# Patient Record
Sex: Male | Born: 1935 | Race: Black or African American | Hispanic: No | Marital: Married | Smoking: Former smoker
Health system: Southern US, Community
[De-identification: ages and names within clinical notes are randomized; demographics above are authoritative.]

## PROBLEM LIST (undated history)

## (undated) DIAGNOSIS — I214 Non-ST elevation (NSTEMI) myocardial infarction: Secondary | ICD-10-CM

## (undated) DIAGNOSIS — I517 Cardiomegaly: Secondary | ICD-10-CM

## (undated) DIAGNOSIS — D509 Iron deficiency anemia, unspecified: Secondary | ICD-10-CM

## (undated) DIAGNOSIS — R6251 Failure to thrive (child): Secondary | ICD-10-CM

---

## 1997-03-02 HISTORY — PX: CATARACT EXTRACTION, BILATERAL: SHX1313

## 2016-03-02 DIAGNOSIS — I214 Non-ST elevation (NSTEMI) myocardial infarction: Secondary | ICD-10-CM

## 2016-03-02 HISTORY — DX: Non-ST elevation (NSTEMI) myocardial infarction: I21.4

## 2016-04-30 DIAGNOSIS — R6251 Failure to thrive (child): Secondary | ICD-10-CM

## 2016-04-30 HISTORY — DX: Failure to thrive (child): R62.51

## 2016-05-25 ENCOUNTER — Emergency Department (HOSPITAL_COMMUNITY): Payer: Medicare Other

## 2016-05-25 ENCOUNTER — Encounter (HOSPITAL_COMMUNITY): Payer: Self-pay | Admitting: Emergency Medicine

## 2016-05-25 DIAGNOSIS — K5900 Constipation, unspecified: Secondary | ICD-10-CM | POA: Diagnosis present

## 2016-05-25 DIAGNOSIS — L89150 Pressure ulcer of sacral region, unstageable: Secondary | ICD-10-CM | POA: Diagnosis present

## 2016-05-25 DIAGNOSIS — Z6822 Body mass index (BMI) 22.0-22.9, adult: Secondary | ICD-10-CM | POA: Diagnosis not present

## 2016-05-25 DIAGNOSIS — Z66 Do not resuscitate: Secondary | ICD-10-CM | POA: Diagnosis present

## 2016-05-25 DIAGNOSIS — K761 Chronic passive congestion of liver: Secondary | ICD-10-CM | POA: Diagnosis present

## 2016-05-25 DIAGNOSIS — T68XXXA Hypothermia, initial encounter: Secondary | ICD-10-CM

## 2016-05-25 DIAGNOSIS — I21A1 Myocardial infarction type 2: Secondary | ICD-10-CM | POA: Diagnosis present

## 2016-05-25 DIAGNOSIS — R7401 Elevation of levels of liver transaminase levels: Secondary | ICD-10-CM | POA: Diagnosis present

## 2016-05-25 DIAGNOSIS — E162 Hypoglycemia, unspecified: Secondary | ICD-10-CM | POA: Diagnosis present

## 2016-05-25 DIAGNOSIS — Z9189 Other specified personal risk factors, not elsewhere classified: Secondary | ICD-10-CM

## 2016-05-25 DIAGNOSIS — I429 Cardiomyopathy, unspecified: Secondary | ICD-10-CM | POA: Diagnosis present

## 2016-05-25 DIAGNOSIS — I5021 Acute systolic (congestive) heart failure: Secondary | ICD-10-CM | POA: Diagnosis not present

## 2016-05-25 DIAGNOSIS — Z515 Encounter for palliative care: Secondary | ICD-10-CM | POA: Diagnosis present

## 2016-05-25 DIAGNOSIS — Z7401 Bed confinement status: Secondary | ICD-10-CM

## 2016-05-25 DIAGNOSIS — R680 Hypothermia, not associated with low environmental temperature: Secondary | ICD-10-CM | POA: Diagnosis present

## 2016-05-25 DIAGNOSIS — R748 Abnormal levels of other serum enzymes: Secondary | ICD-10-CM

## 2016-05-25 DIAGNOSIS — N179 Acute kidney failure, unspecified: Secondary | ICD-10-CM | POA: Diagnosis present

## 2016-05-25 DIAGNOSIS — G9341 Metabolic encephalopathy: Secondary | ICD-10-CM | POA: Diagnosis present

## 2016-05-25 DIAGNOSIS — E860 Dehydration: Secondary | ICD-10-CM | POA: Diagnosis present

## 2016-05-25 DIAGNOSIS — R001 Bradycardia, unspecified: Secondary | ICD-10-CM | POA: Diagnosis not present

## 2016-05-25 DIAGNOSIS — Z87891 Personal history of nicotine dependence: Secondary | ICD-10-CM

## 2016-05-25 DIAGNOSIS — E43 Unspecified severe protein-calorie malnutrition: Secondary | ICD-10-CM | POA: Diagnosis present

## 2016-05-25 DIAGNOSIS — I517 Cardiomegaly: Secondary | ICD-10-CM

## 2016-05-25 DIAGNOSIS — R338 Other retention of urine: Secondary | ICD-10-CM

## 2016-05-25 DIAGNOSIS — R9431 Abnormal electrocardiogram [ECG] [EKG]: Secondary | ICD-10-CM | POA: Diagnosis not present

## 2016-05-25 DIAGNOSIS — I214 Non-ST elevation (NSTEMI) myocardial infarction: Secondary | ICD-10-CM | POA: Diagnosis present

## 2016-05-25 DIAGNOSIS — R339 Retention of urine, unspecified: Secondary | ICD-10-CM | POA: Diagnosis present

## 2016-05-25 DIAGNOSIS — Z789 Other specified health status: Secondary | ICD-10-CM | POA: Diagnosis present

## 2016-05-25 DIAGNOSIS — R7989 Other specified abnormal findings of blood chemistry: Secondary | ICD-10-CM | POA: Diagnosis present

## 2016-05-25 DIAGNOSIS — D509 Iron deficiency anemia, unspecified: Secondary | ICD-10-CM | POA: Diagnosis present

## 2016-05-25 DIAGNOSIS — R627 Adult failure to thrive: Secondary | ICD-10-CM | POA: Diagnosis present

## 2016-05-25 DIAGNOSIS — D649 Anemia, unspecified: Secondary | ICD-10-CM | POA: Diagnosis not present

## 2016-05-25 DIAGNOSIS — E875 Hyperkalemia: Secondary | ICD-10-CM | POA: Diagnosis present

## 2016-05-25 DIAGNOSIS — R778 Other specified abnormalities of plasma proteins: Secondary | ICD-10-CM

## 2016-05-25 DIAGNOSIS — L8990 Pressure ulcer of unspecified site, unspecified stage: Secondary | ICD-10-CM | POA: Insufficient documentation

## 2016-05-25 DIAGNOSIS — R740 Nonspecific elevation of levels of transaminase and lactic acid dehydrogenase [LDH]: Secondary | ICD-10-CM

## 2016-05-25 DIAGNOSIS — I5023 Acute on chronic systolic (congestive) heart failure: Secondary | ICD-10-CM | POA: Diagnosis present

## 2016-05-25 DIAGNOSIS — R4182 Altered mental status, unspecified: Secondary | ICD-10-CM | POA: Diagnosis present

## 2016-05-25 DIAGNOSIS — T148XXA Other injury of unspecified body region, initial encounter: Secondary | ICD-10-CM | POA: Diagnosis present

## 2016-05-25 HISTORY — DX: Failure to thrive (child): R62.51

## 2016-05-25 HISTORY — DX: Iron deficiency anemia, unspecified: D50.9

## 2016-05-25 HISTORY — DX: Non-ST elevation (NSTEMI) myocardial infarction: I21.4

## 2016-05-25 HISTORY — DX: Cardiomegaly: I51.7

## 2016-05-25 LAB — CBC
HCT: 26.4 % — ABNORMAL LOW (ref 39.0–52.0)
Hemoglobin: 8.9 g/dL — ABNORMAL LOW (ref 13.0–17.0)
MCH: 26.6 pg (ref 26.0–34.0)
MCHC: 33.7 g/dL (ref 30.0–36.0)
MCV: 79 fL (ref 78.0–100.0)
PLATELETS: 71 10*3/uL — AB (ref 150–400)
RBC: 3.34 MIL/uL — AB (ref 4.22–5.81)
RDW: 17.9 % — ABNORMAL HIGH (ref 11.5–15.5)
WBC: 7.8 10*3/uL (ref 4.0–10.5)

## 2016-05-25 LAB — I-STAT CHEM 8, ED
BUN: 76 mg/dL — AB (ref 6–20)
CALCIUM ION: 0.98 mmol/L — AB (ref 1.15–1.40)
CHLORIDE: 105 mmol/L (ref 101–111)
Creatinine, Ser: 1.5 mg/dL — ABNORMAL HIGH (ref 0.61–1.24)
Glucose, Bld: 149 mg/dL — ABNORMAL HIGH (ref 65–99)
HEMATOCRIT: 18 % — AB (ref 39.0–52.0)
Hemoglobin: 6.1 g/dL — CL (ref 13.0–17.0)
Potassium: 4.7 mmol/L (ref 3.5–5.1)
Sodium: 137 mmol/L (ref 135–145)
TCO2: 19 mmol/L (ref 0–100)

## 2016-05-25 LAB — RETICULOCYTES
RBC.: 2.22 MIL/uL — AB (ref 4.22–5.81)
RETIC COUNT ABSOLUTE: 22.2 10*3/uL (ref 19.0–186.0)
RETIC CT PCT: 1 % (ref 0.4–3.1)

## 2016-05-25 LAB — COMPREHENSIVE METABOLIC PANEL
ALBUMIN: 2 g/dL — AB (ref 3.5–5.0)
ALT: 26 U/L (ref 17–63)
AST: 130 U/L — AB (ref 15–41)
Alkaline Phosphatase: 111 U/L (ref 38–126)
Anion gap: 12 (ref 5–15)
BUN: 81 mg/dL — ABNORMAL HIGH (ref 6–20)
CHLORIDE: 104 mmol/L (ref 101–111)
CO2: 20 mmol/L — AB (ref 22–32)
CREATININE: 1.24 mg/dL (ref 0.61–1.24)
Calcium: 7.4 mg/dL — ABNORMAL LOW (ref 8.9–10.3)
GFR calc non Af Amer: 52 mL/min — ABNORMAL LOW (ref >60)
Glucose, Bld: 148 mg/dL — ABNORMAL HIGH (ref 65–99)
Potassium: 4.7 mmol/L (ref 3.5–5.1)
SODIUM: 136 mmol/L (ref 135–145)
Total Bilirubin: 1.2 mg/dL (ref 0.3–1.2)
Total Protein: 6.2 g/dL — ABNORMAL LOW (ref 6.5–8.1)

## 2016-05-25 LAB — GLUCOSE, CAPILLARY
GLUCOSE-CAPILLARY: 106 mg/dL — AB (ref 65–99)
GLUCOSE-CAPILLARY: 83 mg/dL (ref 65–99)
GLUCOSE-CAPILLARY: 94 mg/dL (ref 65–99)
Glucose-Capillary: 89 mg/dL (ref 65–99)
Glucose-Capillary: 71 mg/dL (ref 65–99)
Glucose-Capillary: 90 mg/dL (ref 65–99)

## 2016-05-25 LAB — LACTATE DEHYDROGENASE: LDH: 482 U/L — AB (ref 98–192)

## 2016-05-25 LAB — CBC WITH DIFFERENTIAL/PLATELET
BASOS ABS: 0 10*3/uL (ref 0.0–0.1)
BASOS PCT: 0 %
EOS ABS: 0 10*3/uL (ref 0.0–0.7)
EOS PCT: 0 %
HCT: 17.1 % — ABNORMAL LOW (ref 39.0–52.0)
Hemoglobin: 5.4 g/dL — CL (ref 13.0–17.0)
Lymphocytes Relative: 12 %
Lymphs Abs: 0.7 10*3/uL (ref 0.7–4.0)
MCH: 24.3 pg — AB (ref 26.0–34.0)
MCHC: 31.6 g/dL (ref 30.0–36.0)
MCV: 77 fL — ABNORMAL LOW (ref 78.0–100.0)
Monocytes Absolute: 0.2 10*3/uL (ref 0.1–1.0)
Monocytes Relative: 4 %
Neutro Abs: 4.9 10*3/uL (ref 1.7–7.7)
Neutrophils Relative %: 84 %
PLATELETS: 206 10*3/uL (ref 150–400)
RBC: 2.22 MIL/uL — AB (ref 4.22–5.81)
RDW: 19 % — ABNORMAL HIGH (ref 11.5–15.5)
WBC: 5.8 10*3/uL (ref 4.0–10.5)

## 2016-05-25 LAB — PROCALCITONIN: PROCALCITONIN: 18.35 ng/mL

## 2016-05-25 LAB — CBG MONITORING, ED
GLUCOSE-CAPILLARY: 77 mg/dL (ref 65–99)
GLUCOSE-CAPILLARY: 89 mg/dL (ref 65–99)
GLUCOSE-CAPILLARY: 104 mg/dL — AB (ref 65–99)
Glucose-Capillary: 121 mg/dL — ABNORMAL HIGH (ref 65–99)
Glucose-Capillary: 121 mg/dL — ABNORMAL HIGH (ref 65–99)
Glucose-Capillary: 144 mg/dL — ABNORMAL HIGH (ref 65–99)

## 2016-05-25 LAB — URINALYSIS, ROUTINE W REFLEX MICROSCOPIC
BILIRUBIN URINE: NEGATIVE
GLUCOSE, UA: NEGATIVE mg/dL
Ketones, ur: NEGATIVE mg/dL
Nitrite: NEGATIVE
PROTEIN: NEGATIVE mg/dL
Specific Gravity, Urine: 1.013 (ref 1.005–1.030)
pH: 5 (ref 5.0–8.0)

## 2016-05-25 LAB — IRON AND TIBC
IRON: 11 ug/dL — AB (ref 45–182)
Saturation Ratios: 6 % — ABNORMAL LOW (ref 17.9–39.5)
TIBC: 188 ug/dL — ABNORMAL LOW (ref 250–450)
UIBC: 177 ug/dL

## 2016-05-25 LAB — VITAMIN B12: Vitamin B-12: 4200 pg/mL — ABNORMAL HIGH (ref 180–914)

## 2016-05-25 LAB — BRAIN NATRIURETIC PEPTIDE

## 2016-05-25 LAB — PREPARE RBC (CROSSMATCH)

## 2016-05-25 LAB — I-STAT CG4 LACTIC ACID, ED: Lactic Acid, Venous: 2.23 mmol/L (ref 0.5–1.9)

## 2016-05-25 LAB — TROPONIN I: Troponin I: 1.64 ng/mL (ref <0.03)

## 2016-05-25 LAB — I-STAT TROPONIN, ED: Troponin i, poc: 2.23 ng/mL (ref 0.00–0.08)

## 2016-05-25 LAB — POC OCCULT BLOOD, ED: Fecal Occult Bld: NEGATIVE

## 2016-05-25 LAB — TSH: TSH: 2.205 u[IU]/mL (ref 0.350–4.500)

## 2016-05-25 LAB — ABO/RH: ABO/RH(D): A POS

## 2016-05-25 LAB — FOLATE: Folate: 19.1 ng/mL (ref >5.9)

## 2016-05-25 LAB — FERRITIN: FERRITIN: 97 ng/mL (ref 24–336)

## 2016-05-25 LAB — MRSA PCR SCREENING: MRSA BY PCR: NEGATIVE

## 2016-05-25 LAB — PROTIME-INR
INR: 2.09
Prothrombin Time: 23.8 seconds — ABNORMAL HIGH (ref 11.4–15.2)

## 2016-05-25 LAB — MAGNESIUM: Magnesium: 2.3 mg/dL (ref 1.7–2.4)

## 2016-05-25 MED ORDER — FLEET ENEMA 7-19 GM/118ML RE ENEM
1.0000 | ENEMA | Freq: Once | RECTAL | Status: DC | PRN
Start: 2016-05-25 — End: 2016-05-26

## 2016-05-25 MED ORDER — SODIUM CHLORIDE 0.9% FLUSH
3.0000 mL | Freq: Two times a day (BID) | INTRAVENOUS | Status: DC
  Administered 2016-05-25 – 2016-05-26 (×2): 3 mL via INTRAVENOUS

## 2016-05-25 MED ORDER — POLYETHYLENE GLYCOL 3350 17 G PO PACK: 17.0000 g | PACK | Freq: Every day | ORAL | Status: DC | PRN

## 2016-05-25 MED ORDER — BOOST / RESOURCE BREEZE PO LIQD
1.0000 | Freq: Three times a day (TID) | ORAL | Status: DC
  Administered 2016-05-25: 1 via ORAL
  Filled 2016-05-25: qty 1

## 2016-05-25 MED ORDER — DEXTROSE 50 % IV SOLN
50.0000 mL | Freq: Once | INTRAVENOUS | Status: AC
  Administered 2016-05-25: 50 mL via INTRAVENOUS
  Filled 2016-05-25: qty 50

## 2016-05-25 MED ORDER — ONDANSETRON HCL 4 MG/2ML IJ SOLN: 4.0000 mg | Freq: Four times a day (QID) | INTRAMUSCULAR | Status: DC | PRN

## 2016-05-25 MED ORDER — SODIUM CHLORIDE 0.9 % IV SOLN
10.0000 mL/h | Freq: Once | INTRAVENOUS | Status: AC
  Administered 2016-05-25: 10 mL/h via INTRAVENOUS

## 2016-05-25 MED ORDER — FUROSEMIDE 10 MG/ML IJ SOLN
40.0000 mg | Freq: Once | INTRAMUSCULAR | Status: DC
  Filled 2016-05-25: qty 4

## 2016-05-25 MED ORDER — DEXTROSE-NACL 5-0.9 % IV SOLN
INTRAVENOUS | Status: DC
  Administered 2016-05-25: 21:00:00 via INTRAVENOUS

## 2016-05-25 MED ORDER — DEXTROSE 10 % IV SOLN
INTRAVENOUS | Status: DC
  Administered 2016-05-25: 06:00:00 via INTRAVENOUS

## 2016-05-25 MED ORDER — SODIUM CHLORIDE 0.9 % IV BOLUS (SEPSIS): 1000.0000 mL | Freq: Once | INTRAVENOUS | Status: DC

## 2016-05-25 MED ORDER — DOCUSATE SODIUM 100 MG PO CAPS
100.0000 mg | ORAL_CAPSULE | Freq: Two times a day (BID) | ORAL | Status: DC
  Administered 2016-05-26: 100 mg via ORAL
  Filled 2016-05-25: qty 1

## 2016-05-25 MED ORDER — DEXTROSE 50 % IV SOLN
1.0000 | Freq: Once | INTRAVENOUS | Status: AC
Start: 2016-05-25 — End: 2016-05-25
  Administered 2016-05-25: 50 mL via INTRAVENOUS

## 2016-05-25 MED ORDER — DEXTROSE 50 % IV SOLN
INTRAVENOUS | Status: AC
  Administered 2016-05-25: 05:00:00
  Filled 2016-05-25: qty 50

## 2016-05-25 MED ORDER — BISACODYL 10 MG RE SUPP: 10.0000 mg | Freq: Every day | RECTAL | Status: DC | PRN

## 2016-05-25 MED ORDER — ONDANSETRON HCL 4 MG PO TABS: 4.0000 mg | ORAL_TABLET | Freq: Four times a day (QID) | ORAL | Status: DC | PRN

## 2016-05-25 MED ORDER — ENSURE ENLIVE PO LIQD
237.0000 mL | Freq: Two times a day (BID) | ORAL | Status: DC
Start: 2016-05-25 — End: 2016-05-26
  Administered 2016-05-25: 237 mL via ORAL
  Filled 2016-05-25: qty 237

## 2016-05-25 MED ORDER — SODIUM CHLORIDE 0.9 % IV SOLN
Freq: Once | INTRAVENOUS | Status: AC
  Administered 2016-05-25: 10:00:00 via INTRAVENOUS

## 2016-05-25 MED ORDER — SODIUM CHLORIDE 0.9 % IV SOLN: Freq: Once | INTRAVENOUS | Status: DC

## 2016-05-25 MED ORDER — ACETAMINOPHEN 325 MG PO TABS: 650.0000 mg | ORAL_TABLET | Freq: Four times a day (QID) | ORAL | Status: DC | PRN

## 2016-05-25 MED ORDER — ACETAMINOPHEN 650 MG RE SUPP: 650.0000 mg | Freq: Four times a day (QID) | RECTAL | Status: DC | PRN

## 2016-05-25 NOTE — ED Notes (Signed)
Cardiology at bedside evaluating patient.

## 2016-05-25 NOTE — ED Triage Notes (Signed)
Per EMS, pt from home with c/o AMS, lethargy, confusion. Initial CBG-31, approximately 12g D10 given PTA, EMS repeat CBG-41. Per family, pt has had failure to thrive for the last 30 days with a 50lb weight loss. BP-116/64, HR-90

## 2016-05-25 NOTE — Consult Note (Signed)
Cardiology Consult    Patient ID: Brady LappingRobert Simpson MRN: [id number], DOB/AGE: [birth year]   Admit date: 05/24/2016 Date of Consult: 05/24/2016  Primary Physician: No PCP Per Patient Primary Cardiologist: new - Dr. Royann Shiversroitoru Requesting Provider: Dr. Clyde LundborgNiu  Reason for Consult: elevated troponin and BNP   Patient Profile    Brady Simpson is an 83 yo male who has not seen a doctor in many years, [facility] with altered mental status and hypoglycemia. He was found to be anemic that was symptomatic and hypothermic. Cardiology was consulted for elevated troponin and BNP.  Past Medical History   History reviewed. No pertinent past medical history.  Past Surgical History:  Procedure Laterality Date  . CATARACT EXTRACTION, BILATERAL  1999     Allergies  No Known Allergies  History of Present Illness    Brady Simpson was found confused this morning, son called EMS.  Pt was found to be hypoglycemic (CBG 33) and anemic (Hb 5.4). He was given D10 and 1U PRBC. He was hypothermic with a lactic acid > 2.0.  BNP was found elevated to > 4500. Cardiology was consulted for management of previously unknown CHF and elevated troponin.   Pt states he started having LE edema the 1st week of February. He has also lost about 50 lbs over the past 4-5 months. When questioned, pt family states he hasn't been eating well and only snacking on sweets for the past month. Pt son states he had chest pain in Feb 2018; EMS was called but pt refused medical treatment at the time. Pt has refused to visit a PCP or cardiologist. On exam, he appears frail, but is alert and oriented x 4. He denies chest pain, but has had shortness of breath for 3-4 months. He does not have any documented medical problems and takes no medication at home. Per patient's family, he no longer gets out of bed on his own because of foot pain since January; this is likely related to LE edema. Pt family states his swelling is worse at night. He sleeps on  2 pillows, but pt family states this is not due to SOB.    Inpatient Medications    . feeding supplement  1 Container Oral TID BM  . feeding supplement (ENSURE ENLIVE)  237 mL Oral BID BM     Outpatient Medications    Prior to Admission medications   Not on File     Family History     Family History  Problem Relation Age of Onset  . Diabetes Sister   . Hypertension Brother     Social History    Social History   Social History  . Marital status: Married    Spouse name: N/A  . Number of children: N/A  . Years of education: N/A   Occupational History  . Not on file.   Social History Main Topics  . Smoking status: Former Smoker    Types: Cigarettes    Quit date: 2012  . Smokeless tobacco: Never Used  . Alcohol use No     Comment: Quit 2012  . Drug use: No  . Sexual activity: No   Other Topics Concern  . Not on file   Social History Narrative  . No narrative on file     Review of Systems    General:  No chills, fever, night sweats or weight changes.  Cardiovascular:  No chest pain, dyspnea on exertion, orthopnea, palpitations, paroxysmal nocturnal dyspnea Positive for  edema Dermatological: No rash, lesions/masses Respiratory: No cough, Positive for dyspnea Urologic: No hematuria, dysuria Abdominal:   No nausea, vomiting, diarrhea, bright red blood per rectum, melena, or hematemesis Neurologic:  No visual changes, wkns, changes in mental status. All other systems reviewed and are otherwise negative except as noted above.  Physical Exam    Blood pressure 100/82, pulse 65, temperature 97.3 F (36.3 C), temperature source Oral, resp. rate (!) 27, SpO2 93 %.  General: Pleasant, NAD Psych: Normal affect. Neuro: Alert and oriented X 3. Moves all extremities spontaneously. HEENT: Normal  Neck: Supple without bruits, +JVD. Lungs:  Resp regular and unlabored, CTA in upper lobes, diminished in bases. Heart: RRR no murmurs. Abdomen: Soft, non-tender,  non-distended, BS + x 4.  Extremities: No clubbing, cyanosis.  2+-3+ B LE edema. DP/PT/Radials 1+ and equal bilaterally.  Labs    Troponin [facility] of Care Test)  Recent Labs  10-Jun-2016 0513  TROPIPOC 2.23*   No results for input(s): CKTOTAL, CKMB, TROPONINI in the last 72 hours. Lab Results  Component Value Date   WBC 5.8 06-10-16   HGB 6.1 (LL) 06-10-2016   HCT 18.0 (L) 2016-06-10   MCV 77.0 (L) 06-10-2016   PLT 206 2016/06/10     Recent Labs Lab Jun 10, 2016 0510 2016/06/10 0515  NA 136 137  K 4.7 4.7  CL 104 105  CO2 20*  --   BUN 81* 76*  CREATININE 1.24 1.50*  CALCIUM 7.4*  --   PROT 6.2*  --   BILITOT 1.2  --   ALKPHOS 111  --   ALT 26  --   AST 130*  --   GLUCOSE 148* 149*   No results found for: CHOL, HDL, LDLCALC, TRIG No results found for: [facility]   Radiology Studies    Dg Chest Port 1 View  Result Date: Jun 10, 2016 CLINICAL DATA:  Weakness. Altered mental status tonight. Weight loss. EXAM: PORTABLE CHEST 1 VIEW COMPARISON:  None. FINDINGS: Lung volumes are low. Multiple overlying monitoring devices. Mild cardiomegaly. Atherosclerosis of the thoracic aorta. Hazy lung base opacities suggest pleural effusions and bibasilar airspace disease. Mild vascular congestion. Streaky left midlung opacity. No pneumothorax. No evidence of acute osseous abnormality. IMPRESSION: Mild cardiomegaly. Hazy lung base opacities suggest pleural effusions and bibasilar airspace disease, likely atelectasis. Mild vascular congestion. Findings may reflect fluid overload/ CHF. Electronically Signed   By: Rubye Oaks M.D.   On: 06/10/2016 05:34    ECG & Cardiac Imaging    EKG 06/10/2016: sinus rhythm  Echocardiogram 06-10-2016: pending   Assessment & Plan    1. Elevated troponin - troponin 2.23, continue cycling troponin - EKG without clear signs of ischemia - pt denies current and past chest pain - troponin elevation may be due to demand ischemia in the presence of systolic heart  failure and elevated BNP - he is not a candidate for invasive intervention at this time   2. CHF, unknown type / shortness of breath - BNP on admission > 4500 - echocardiogram pending - CXR with cardiomegaly and bilateral pleural effusions - pt is maintaining on room air - he reports several months of shortness of breath - will initiate IV lasix   3. AKI - no previous records - sCr 1.50 (1.24 on admission); foley in place for exact measurement - follow renal function in the presence of diuresis, daily BMP   4. Anemia - 1U PRBC given in ED; repeat Hb 6.1 (5.4) - per primary team   5.  Hypoglycemia  - per primary team; now resolved   6. FTT - encouraged PO intake - family states he has only been eating sweets and occasional fast food because he doesn't like the cooking - family states they give him a bottle of water each day and he drink the water   7. End of Life Decisions: - per patient, he stated he wanted compressions and shock if his heart stops - currently DNR per ED provider - ask that the hospitalist/medicine team clarify code status    Signed, Marcelino Duster, PA-C 05/19/2016, 1:45 PM  I have seen and examined the patient along with Marcelino Duster, PA.  I have reviewed the chart, notes and new data.  I agree with PA's note.  Key new complaints: denies angina recently, but had chest discomfort in February when he declined EMS transport to the hospital. No dyspnea lying flat in bed right now. Key examination changes: 3+ soft pitting edema of both legs, no rales, no S3. JVD is present. Key new findings / data: marked cardiomegaly and CHF on CXR; creat 1.5. Severe anemia, microcytic. NSR, LBBB. "Bradycardia" on telemetry is artifactual (undersensed R waves).  PLAN: Workup and treatment of anemia. Echo. High suspicion for CAD/ischemic CMP, but ECG is nondiagnostic due to LBBB (of unknown chronicity). Invasive CAD workup will have to wait while we identify  the cause of his anemia (any treatment would be associated with a commitment to antiplatelet therapy, with potentially disastrous consequences if he has a GI bleeding source). Clearly hypervolemic. Will benefit from diuretics (needs substantial, but slow diuresis) with monitoring of renal function. Further recommendations after reviewing echo.  Thurmon Fair, MD, [facility] [phone] 05/24/2016, 4:33 PM

## 2016-05-25 NOTE — ED Notes (Signed)
Spoke with NP, current Blood transfusion is to continue. This is his second unit of PRBC. After this unit, obtain  CBC and reevaluate for need of third unit.

## 2016-05-25 NOTE — ED Notes (Signed)
Bair hugger applied, family at bedside

## 2016-05-25 NOTE — H&P (Signed)
History and Physical    Brady LappingRobert Simpson GNF:[id number] DOB: 1931-02-19 DOA: 05/20/2016   PCP: No PCP Per Patient   Patient coming from/Resides with: Private residence/lives with son  Admission status: Inpatient/SDU-medically necessary to stay a minimum 2 midnights to rule out impending and/or unexpected changes in physiologic status that may differ from initial evaluation performed in the ER and/or at time of admission. Patient presents with altered mentation in setting of profound hypoglycemia and symptomatic anemia of uncertain etiology. Patient is critically ill with associated hypothermia and failure to thrive symptoms greater than 6 months worse over the past 30 days. Patient will require telemetry monitoring given elevated BNP and troponin and likely non-STEMI and possible acute heart failure (may have had remote MI in the past 45 days with associated abnormal EKG). He will require frequent nursing care regarding intake and output, frequent monitoring of CBGs and temperature, he may become more hemodynamically unstable as body temperature normalizes therefore frequent vital signs are indicated as well. He also has a deep tissue injury on his sacrum that is at high risk for evolving into a full-blown decubitus therefore skin care protocol for nursing staff has been initiated and an air mattress ordered. His CODE STATUS has been clarified as DO NOT RESUSCITATE at time of admission and given the appearance of overall poor prognosis he may require palliative evaluation as an inpatient for discharge disposition is determined.  Chief Complaint: Altered mental status  HPI: Brady Simpson is a 83 y.o. male with medical history significant for prior cataract surgery as well as remote utilization of tobacco and alcohol with cessation in 2012. Patient was brought to the emergency department by EMS after the son was called to the home for altered mental status. Initial CBG at the scene was 33 and a D10 infusion as  well as a D50 bolus was given. According to the son the patient was walking normally and able to perform his own ADLs until 30 days ago and has now become bed bound with very minimal solid food intake. The granddaughter reports he is only drinking ensure and has refused to be taken to any physician or to the hospital.  Upon my discussion with the patient after he has been treated in the ER and subsequently improve her mental status standpoint, he reports that last summer he lost his appetite and just stopped eating and drinking. He states at that time he became very weak. Prior to this he was driving and able to mow the grass. He may or may not have been drinking increased Pepto-Bismol although he denies any issue such as abdominal pain, indigestion, postprandial symptoms or difficulty passing bowel movements. He did admit to drinking "lots of" Ensure Plus but states "this really didn't help me". He states they're the past few weeks he has been unable to walk and has primarily been sitting in the better and the chair. He denies any previous medical problems and reports he last saw a physician in 1999 when he had cataract surgery  ED Course:  Vital Signs: BP 105/76 (BP Location: Right Arm)   Pulse 87   Temp 97.6 F (36.4 C) (Oral)   Resp (!) 23   SpO2 93%  PCXR: Cardiomegaly with hazy lung bases with opacity suggestive of pleural effusions and mild basilar airspace disease more consistent with atelectasis. There is also mild vascular congestion that may reflect fluid overload or CHF Lab data: Sodium 136, potassium 4.7, chloride 104, CO2 20, anion gap 12, glucose 148, BUN  81, creatinine 1.24, calcium 7.4, with ionized calcium 0.98, alkaline phosphatase 111, albumin 2.0, AST 1:30, ALT 26, total protein 6.2, total bilirubin 1.2, BNP greater than 4500, POC troponin 2.23, iron 11, U IVC 177, TIBC 188, saturation 6, ferritin 97, folate 19.1, B12 4200, lactic acid 2.23, white count 5800, hemoglobin 5.4, MCV 77,  platelets 206,000, neutrophils 84% normal absolute neutrophils, PT slightly elevated 23.8, INR 2.09, TSH 2.205, blood cultures obtained in ER, urinalysis and culture ordered but patient has been unable to void Medications and treatments: D50 1 amp, normal saline bolus 1 L, D10 infusion at 100 mL/hr, additional D50 amps 2, Lasix ordered but held and I have subsequently discontinued  Review of Systems:  In addition to the HPI above,  No Fever-chills, myalgias or other constitutional symptoms No Headache, changes with Vision or hearing, new weakness, tingling, numbness in any extremity, dizziness, dysarthria or word finding difficulty, tremors or seizure activity No problems swallowing food or Liquids, indigestion/reflux, choking or coughing while eating, abdominal pain with or after eating No Chest pain, Cough or Shortness of Breath, palpitations, orthopnea or DOE No Abdominal pain, N/V, melena,hematochezia, dark tarry stools No dysuria, malodorous urine, hematuria or flank pain No new skin rashes, lesions, masses or bruises, No new joint pains, aches, swelling or redness No recent unintentional weight gain  No polyuria, polydypsia or polyphagia   History reviewed. No pertinent past medical history.  Past Surgical History:  Procedure Laterality Date  . CATARACT EXTRACTION, BILATERAL  1999    Social History   Social History  . Marital status: Married    Spouse name: N/A  . Number of children: N/A  . Years of education: N/A   Occupational History  . Not on file.   Social History Main Topics  . Smoking status: Former Smoker    Types: Cigarettes    Quit date: 2012  . Smokeless tobacco: Never Used  . Alcohol use No     Comment: Quit 2012  . Drug use: No  . Sexual activity: No   Other Topics Concern  . Not on file   Social History Narrative  . No narrative on file    Mobility: Progressive functional decline noting in summer 2017 was able to mow lawn and in the past 3-4  weeks has become bedbound and nonambulatory Work history: Not obtained   No Known Allergies  Family history reviewed and not pertinent; patient reports he thinks there are several family members with diabetes but otherwise he cannot recall any significant family history when questioned  Prior to Admission medications   NONE    Physical Exam: Vitals:   05/30/2016 0643 05/05/2016 0730 05/14/2016 0740 05/07/2016 0806  BP:  105/76 105/76 120/88  Pulse: 88 87 87   Resp: 16 20 (!) 23   Temp: (!) 93.7 F (34.3 C)  97.6 F (36.4 C)   TempSrc: Oral  Oral   SpO2: 99% 92% 93%       Constitutional: Pleasant and calm, appears quite malnourished and cachectic Eyes: Irregular shaped pupils post cataract surgery, lids and sclera normal, conjunctiva pale ENMT: Mucous membranes are dry. Posterior pharynx clear of any exudate or lesions. Poor dentition.  Neck: normal, supple, no masses, no thyromegaly Respiratory: clear to auscultation bilaterally, no wheezing, no crackles. Normal respiratory effort. No accessory muscle use. Diminished bilateral bases. Not hypoxic buttocks supplemental oxygen applied Cardiovascular: Regular rate and rhythm, no murmurs / rubs / gallops. Marked 3-4+ bilateral lower extremity edema from knees to feet  more dense starting just above the ankles with predominant pedal edema. 1+ pedal pulses. No carotid bruits.  Abdomen: no tenderness, slightly distended, no masses palpated. No hepatosplenomegaly. Bowel sounds positive but hypoactive.  Musculoskeletal: no clubbing / cyanosis. No joint deformity upper and lower extremities. Good ROM, no contractures. Normal muscle tone.  Skin: no rashes, lesions, ulcers. No induration-darkened area over majority of sacrum and initial prominences concerning for a deep tissue injury Neurologic: CN 2-12 grossly intact. Sensation intact, DTR normal. Strength 2/5 x all 4 extremities, unable to turn self in bed due to profound generalized  weakness Psychiatric: Alert and oriented x name and place. Flat affect although overall pleasant mood.    Labs on Admission: I have personally reviewed following labs and imaging studies  CBC:  Recent Labs Lab 05/23/2016 0510 05/16/2016 0515  WBC 5.8  --   NEUTROABS 4.9  --   HGB 5.4* 6.1*  HCT 17.1* 18.0*  MCV 77.0*  --   PLT 206  --    Basic Metabolic Panel:  Recent Labs Lab 05/24/2016 0510 05/03/2016 0515  NA 136 137  K 4.7 4.7  CL 104 105  CO2 20*  --   GLUCOSE 148* 149*  BUN 81* 76*  CREATININE 1.24 1.50*  CALCIUM 7.4*  --    GFR: CrCl cannot be calculated (Unknown ideal weight.). Liver Function Tests:  Recent Labs Lab 05/08/2016 0510  AST 130*  ALT 26  ALKPHOS 111  BILITOT 1.2  PROT 6.2*  ALBUMIN 2.0*   No results for input(s): LIPASE, AMYLASE in the last 168 hours. No results for input(s): AMMONIA in the last 168 hours. Coagulation Profile:  Recent Labs Lab 05/01/2016 0510  INR 2.09   Cardiac Enzymes: No results for input(s): CKTOTAL, CKMB, CKMBINDEX, TROPONINI in the last 168 hours. BNP (last 3 results) No results for input(s): PROBNP in the last 8760 hours. HbA1C: No results for input(s): HGBA1C in the last 72 hours. CBG:  Recent Labs Lab 05/03/2016 0439 05/28/2016 0503 05/20/2016 0626 05/24/2016 0721 05/06/2016 0741  GLUCAP <10* 77 89 104* 121*   Lipid Profile: No results for input(s): CHOL, HDL, LDLCALC, TRIG, CHOLHDL, LDLDIRECT in the last 72 hours. Thyroid Function Tests:  Recent Labs  05/22/2016 0510  TSH 2.205   Anemia Panel:  Recent Labs  05/11/2016 0510  VITAMINB12 4,200*  FOLATE 19.1  FERRITIN 97  TIBC 188*  IRON 11*  RETICCTPCT 1.0   Urine analysis: No results found for: COLORURINE, APPEARANCEUR, LABSPEC, PHURINE, GLUCOSEU, HGBUR, BILIRUBINUR, KETONESUR, PROTEINUR, UROBILINOGEN, NITRITE, LEUKOCYTESUR Sepsis Labs: @LABRCNTIP (procalcitonin:4,lacticidven:4) )No results found for this or any previous visit (from the past 240  hour(s)).   Radiological Exams on Admission: Dg Chest Port 1 View  Result Date: 05/18/2016 CLINICAL DATA:  Weakness. Altered mental status tonight. Weight loss. EXAM: PORTABLE CHEST 1 VIEW COMPARISON:  None. FINDINGS: Lung volumes are low. Multiple overlying monitoring devices. Mild cardiomegaly. Atherosclerosis of the thoracic aorta. Hazy lung base opacities suggest pleural effusions and bibasilar airspace disease. Mild vascular congestion. Streaky left midlung opacity. No pneumothorax. No evidence of acute osseous abnormality. IMPRESSION: Mild cardiomegaly. Hazy lung base opacities suggest pleural effusions and bibasilar airspace disease, likely atelectasis. Mild vascular congestion. Findings may reflect fluid overload/ CHF. Electronically Signed   By: Rubye Oaks M.D.   On: 05/13/2016 05:34    EKG: (Independently reviewed) likely first-degree AV block with prolonged P-R interval with wide QRS consistent with intraventricular conduction delay/? New onset left bundle branch block, poor R-wave rotation with  extremely low voltage R waves in all leads; prolonged QTc of 570 ms setting of widened QRS  Assessment/Plan Principal Problem:   Acute metabolic encephalopathy due to hypoglycemia -Patient presents with altered mental status in the setting of profound hypoglycemia with initial CBG 33 -CBGs have improved to between 89 and 121 after administration of concentrated dextrose as well as a D10 infusion which will be continued -Follow CBGs q 2 hrs; likely can decrease to q 4 hrs  -Encourage oral intake -Cortisol pending  Active Problems:   NSTEMI (non-ST elevated myocardial infarction)  -Patient presents with abnormal EKG and elevated troponin in setting of hypothermia, profound anemia and profound malnutrition -EDP has consulted cardiology that given her overall presentation does not appear to be a candidate for aggressive evaluation or intervention -Given profound anemia that has not been  definitively clarified as not from GI bleeding source will not anticoagulate including with aspirin -Cycle troponin -Patient denied any preadmission symptoms of chest pain, shortness of breath or dyspnea on exertion -Echocardiogram -At best focus will be on medical management -Consider beta blocker pending echocardiogram results    Hypothermia -Occurred in setting of profound hypoglycemia and failure to thrive -Continue warming blanket until normothermic -Rule out occult source of infection given abnormal chest x-ray: ck Procalcitonin in follow-up on blood cultures obtained in the ER -Obtain urinalysis and culture-apply condom catheter    Acute Urinary retention  -700 ccs PVR -Insert foley -FU on UA/cx -PSA    FTT (failure to thrive) in adult/Severe protein-calorie malnutrition -Patient reports stopped eating and drinking well and has been becoming more progressively weak since the summer of 2017 but at the same time has refused attempts by family to procure medical care -Albumin 2.0-check pre-albumin -Corrected calcium is 9.32 which appears consistent with dehydration -Patient markedly iron deficient at 11 -AST elevated at 130 with otherwise normal transaminases which could be consistent with hepatic stress from malnutrition and exhaustion of glycogen stores -Nutrition consultation -Clear liquids with crackers, resource beverage and insure beverage -Concern for possible underlying malignancy (see below) -Check magnesium -Given presentation may need pan CT scan of chest, abdomen and pelvis or surveillance for possible malignancy    Symptomatic anemia/Iron deficiency anemia -Baseline hemoglobin unknown and patient presents with hemoglobin 5.6 -Anemia panel reflective of severe iron deficiency-consider IV iron administration during this admission -EDP has ordered 2 units of packed red blood cells-CBC post transfusion but anticipate likely will require at least 2-3 more units -CBC in  a.m. -Platelets and total bilirubin are normal making hemolysis less likely but for completeness of exam follow-up on LDH and haptoglobin previously ordered -Peripheral smear pathology is pending -Adjunct of supplemental nasal cannula oxygen especially in setting of concomitant non-STEMI -TSH normal    Elevated brain natriuretic peptide (BNP) level -Associated with abnormal chest x-ray without hypoxia and marked peripheral edema -Echocardiogram -Edema likely reflective of hypoalbuminemic state and malnutrition - daily weight and strict intake and output-apply condom catheter -Initiate pharmacotherapy based on echocardiogram -No indication at this juncture to give Lasix but monitor for respiratory symptoms -Repeat chest x-ray in a.m.-unclear at this juncture if infectious related or secondary to heart failure -Given lack of clarity at time of admission regarding actual diagnosis did not initiate adult heart failure order set that this can be initiated if diagnosis confirmed    Transaminitis -AST only elevated and likely related to profound malnutrition and depletion of glycogen stores -Follow labs    Deep tissue injury/sacrum -Skin protocol order set initiated Engineer, civil (consulting)  mattress -Hydrocolloid dressing    Quit using tobacco in remote past (2012)/Quit consuming alcohol in remote past (2012) -Patient admits to formally using alcohol tobacco products but quit in 2012    Constipation -Begin Colace -Utilize prn LOC (see orders)      DVT prophylaxis: SCDs Code Status: DO NOT RESUSCITATE  Family Communication: Son, granddaughter and other family members at bedside Disposition Plan: BE determined Consults called: Cardiologist on call by Dr. Mora Bellman ANP-BC [facility] Pager [phone]   If 7PM-7AM, please contact night-coverage [email or web address] Password [facility]  06-02-16, 8:23 AM

## 2016-05-25 NOTE — ED Notes (Signed)
Pt brought to ED by EMS after pt found to be altered by son, whom he lives with. Initial CBG for EMS 33, up to 41 after D10 infusion. Upon arrival pt found to have CBG of critical low, D50 given IV. Per family pt was walking normally and able to perform his own ADL's 30 days ago, pt now bed, very minimal solid food intake. Per grand daughter pt is only drinking ensures and has refused to be taken to MD or hospital.  Pt takes no meds and has no medical diagnoses per family

## 2016-05-25 NOTE — ED Notes (Signed)
NT Bladder scanned pt on bladder scan.

## 2016-05-25 NOTE — ED Notes (Signed)
bladderscan pt.[id number] of urine

## 2016-05-25 NOTE — ED Provider Notes (Signed)
[facility]-EMERGENCY DEPT Provider Note   CSN: [id number] Arrival date & time: 05/07/2016  4098     History   Chief Complaint Chief Complaint  Patient presents with  . Hypoglycemia  . Altered Mental Status    HPI Brady Simpson is a 83 y.o. male here presenting with hypoglycemia, altered mental status. Patient has not seen a doctor for many years because he refused to. Over the last 30 days or so, patient has been progressively weaker and having poor appetite. His family noticed that he has lost about 50 pounds over the last month or so. Had progressive leg swelling. He also appeared more pale than usual. However patient has persistently refused medical care and does not one be evaluated. Last night, patient was noted to be very confused. His son lives with him so called the EMS. EMS noticed that his blood sugar was 31 and gave him D10 and went up to 41. Patient was then transported to the ER. Patient does deny any melena or blood in his stool.    The history is provided by the patient, a relative and the EMS personnel.    History reviewed. No pertinent past medical history.  Patient Active Problem List   Diagnosis Date Noted  . NSTEMI (non-ST elevated myocardial infarction) (HCC) 05/12/2016    History reviewed. No pertinent surgical history.     Home Medications    Prior to Admission medications   Not on File    Family History No family history on file.  Social History Social History  Substance Use Topics  . Smoking status: Former Games developer  . Smokeless tobacco: Never Used  . Alcohol use Not on file     Allergies   Patient has no known allergies.   Review of Systems Review of Systems  Constitutional: Positive for appetite change and unexpected weight change.  Skin: Positive for pallor.  All other systems reviewed and are negative.    Physical Exam Updated Vital Signs BP 105/77 (BP Location: Left Arm)   Pulse 87   Temp (!) 93.9 F (34.4 C) (Oral)   Resp 16    SpO2 100%   Physical Exam  Constitutional:  Chronically ill, tired, A & O x 2   HENT:  Head: Normocephalic.  MM slightly dry.   Eyes: Pupils are equal, round, and reactive to light.  Conjunctiva slightly pale, ? Scleral icterus   Neck: Normal range of motion. Neck supple.  Cardiovascular: Normal rate, regular rhythm and normal heart sounds.   Pulmonary/Chest: Effort normal.  Diminished breath sounds bilateral bases   Abdominal: Soft.  Mildly distended, nontender.   Musculoskeletal:  1+ edema bilaterally   Neurological: He is alert.  A & O x 2. Nl strength throughout   Skin: Skin is warm.  Psychiatric:  Unable   Nursing note and vitals reviewed.    ED Treatments / Results  Labs (all labs ordered are listed, but only abnormal results are displayed) Labs Reviewed  CBC WITH DIFFERENTIAL/PLATELET - Abnormal; Notable for the following:       Result Value   RBC 2.22 (*)    Hemoglobin 5.4 (*)    HCT 17.1 (*)    MCV 77.0 (*)    MCH 24.3 (*)    RDW 19.0 (*)    All other components within normal limits  COMPREHENSIVE METABOLIC PANEL - Abnormal; Notable for the following:    CO2 20 (*)    Glucose, Bld 148 (*)    BUN 81 (*)  Calcium 7.4 (*)    Total Protein 6.2 (*)    Albumin 2.0 (*)    AST 130 (*)    GFR calc non Af Amer 52 (*)    All other components within normal limits  PROTIME-INR - Abnormal; Notable for the following:    Prothrombin Time 23.8 (*)    All other components within normal limits  RETICULOCYTES - Abnormal; Notable for the following:    RBC. 2.22 (*)    All other components within normal limits  I-STAT CHEM 8, ED - Abnormal; Notable for the following:    BUN 76 (*)    Creatinine, Ser 1.50 (*)    Glucose, Bld 149 (*)    Calcium, Ion 0.98 (*)    Hemoglobin 6.1 (*)    HCT 18.0 (*)    All other components within normal limits  I-STAT TROPOININ, ED - Abnormal; Notable for the following:    Troponin i, poc 2.23 (*)    All other components within normal  limits  I-STAT CG4 LACTIC ACID, ED - Abnormal; Notable for the following:    Lactic Acid, Venous 2.23 (*)    All other components within normal limits  CBG MONITORING, ED - Abnormal; Notable for the following:    Glucose-Capillary <10 (*)    All other components within normal limits  CULTURE, BLOOD (ROUTINE X 2)  CULTURE, BLOOD (ROUTINE X 2)  URINALYSIS, ROUTINE W REFLEX MICROSCOPIC  VITAMIN B12  FOLATE  IRON AND TIBC  FERRITIN  TSH  BRAIN NATRIURETIC PEPTIDE  HAPTOGLOBIN  LACTATE DEHYDROGENASE  SAVE SMEAR  POC OCCULT BLOOD, ED  CBG MONITORING, ED  CBG MONITORING, ED  TYPE AND SCREEN  PREPARE RBC (CROSSMATCH)  ABO/RH    EKG  EKG Interpretation  Date/Time:  Monday May 25 2016 04:51:33 EDT Ventricular Rate:  87 PR Interval:    QRS Duration: 171 QT Interval:  429 QTC Calculation: 517 R Axis:   -94 Text Interpretation:  Sinus rhythm Nonspecific IVCD with LAD Consider anterior infarct Confirmed by Zi Sek  MD, Abi Shoults ([id number]) on 05/25/2016 5:00:40 AM       Radiology Dg Chest Port 1 View  Result Date: 05/30/2016 CLINICAL DATA:  Weakness. Altered mental status tonight. Weight loss. EXAM: PORTABLE CHEST 1 VIEW COMPARISON:  None. FINDINGS: Lung volumes are low. Multiple overlying monitoring devices. Mild cardiomegaly. Atherosclerosis of the thoracic aorta. Hazy lung base opacities suggest pleural effusions and bibasilar airspace disease. Mild vascular congestion. Streaky left midlung opacity. No pneumothorax. No evidence of acute osseous abnormality. IMPRESSION: Mild cardiomegaly. Hazy lung base opacities suggest pleural effusions and bibasilar airspace disease, likely atelectasis. Mild vascular congestion. Findings may reflect fluid overload/ CHF. Electronically Signed   By: Rubye OaksMelanie  Ehinger M.D.   On: 05/25/2016 05:34    Procedures Procedures (including critical care time)  CRITICAL CARE Performed by: Richardean Canalavid H Graziella Connery   Total critical care time: 30 minutes  Critical care time  was exclusive of separately billable procedures and treating other patients.  Critical care was necessary to treat or prevent imminent or life-threatening deterioration.  Critical care was time spent personally by me on the following activities: development of treatment plan with patient and/or surrogate as well as nursing, discussions with consultants, evaluation of patient's response to treatment, examination of patient, obtaining history from patient or surrogate, ordering and performing treatments and interventions, ordering and review of laboratory studies, ordering and review of radiographic studies, pulse oximetry and re-evaluation of patient's condition.   Medications Ordered in ED  Medications  dextrose 10 % infusion ( Intravenous Rate/Dose Change 05/13/2016 0629)  0.9 %  sodium chloride infusion (not administered)  dextrose 50 % solution 50 mL (not administered)  dextrose 50 % solution (  Given 05/28/2016 0450)  dextrose 50 % solution 50 mL (50 mLs Intravenous Given 05/14/2016 0450)     Initial Impression / Assessment and Plan / ED Course  I have reviewed the triage vital signs and the nursing notes.  Pertinent labs & imaging results that were available during my care of the patient were reviewed by me and considered in my medical decision making (see chart for details).     Brady Simpson is a 83 y.o. male here with failure to thrive, AMS, hypoglycemia. He is hypothermic which I think can be from infection vs hypoglycemia. Consider dehydration with failure to thrive, infectious etiology, hypothyroidism, underlying cancer, new onset heart failure, symptomatic anemia. Will get labs, cultures, lactate, trop, CXR. On arrival, his CBG was low. Given D50 and CBG went up to 77. Will start D10 drip.   6:37 AM Labs showed Hg 6.1. No baseline. CO2 20 likely from dehydration but bili normal. Guiac neg. Sent off anemia panel. Ordered 1 U PRBC transfusion. CXR showed mild CHF and clinically volume  overloaded. Trop 2.2 but no obvious STEMI on EKG and I think likely from CHF. Will admit to hospitalist service, consider getting cardiology evaluation in the morning.   6:37 AM Dr. Clyde Lundborg to admit. I called Dr. [place] from cardiology, who will see patient as a Research scientist (medical).    Final Clinical Impressions(s) / ED Diagnoses   Final diagnoses:  None    New Prescriptions New Prescriptions   No medications on file     Charlynne Pander, MD 05/06/2016 [phone]

## 2016-05-25 NOTE — Progress Notes (Addendum)
RN reports pt w/ episodes of non-sustained bradycardia (30s-50s), Cardiology at bedside but as expected nothing further to add (see their full consult note)  1740: Spoke w/ RN on 3W- after 3 attempts staff unable to obtain reuested labs including CBC and family requested to hold off for now on labs. BP stable and >100 so agree with this plan for now. Can possibly try later if pt/family will allow  Junious SilkAllison Angelicia Lessner, ANP

## 2016-05-25 NOTE — Progress Notes (Addendum)
This is a no charge note   Pending admission per Dr. Silverio LayYao,  83 year old man without significant past medical history, not seeing a doctor for many years, who presents with altered mental status since last night. Per family, pt has had failure to thrive for the last 30 days with 50 lb weight loss and bilateral leg edema.   Per EMS, initial CBG-31-->gave D50, and is on D10 at 100 cc/h now. Pt was found to have hypothermia with temperature 94.4, hemoglobin 5.4 (not sure about the baseline), negative FOBT, troponin 2.23, lactic acid 2.23,  INR 2.09, Cre 1.24. AST 130, AST 26, total bilirubin 1.2. bp 100/71. CXR showed hazy lung base opacities suggest pleural effusions and bibasilar airspace disease, likely atelectasis, mild vascular congestion. 2U of blood was ordered.  Card will be called by EDP. Pt is accepted to SDU as inpt.  -will check CBG q1h, LDH, haptoglobin, peripheral smear, anemia panel, cortisol level, CBC every 6 hour -Will give another 50 ml of D50. -get Bx, UA, Ux -bair hugger  Addendum: pt has AMS, I therefore have had extensive discussion with family including his POA (his son), they all agreed that pt will be on DNR, this is also pt's own wish per family.   Lorretta HarpXilin Aj Crunkleton, MD  [facility] Pager [phone]  If 7PM-7AM, please contact night-coverage [email or web address] Password [email or web address] 05/30/2016, 6:47 AM

## 2016-05-25 NOTE — ED Notes (Signed)
Placed Allevyn pad over pt's sacrum where pt has deep tissue injury from being bedbound over past month. Pt presented to hospital with bedsore. Admitting MD aware

## 2016-05-26 ENCOUNTER — Inpatient Hospital Stay (HOSPITAL_COMMUNITY): Payer: Medicare Other

## 2016-05-26 ENCOUNTER — Encounter (HOSPITAL_COMMUNITY): Payer: Self-pay | Admitting: General Practice

## 2016-05-26 DIAGNOSIS — I5021 Acute systolic (congestive) heart failure: Secondary | ICD-10-CM

## 2016-05-26 DIAGNOSIS — Z66 Do not resuscitate: Secondary | ICD-10-CM

## 2016-05-26 DIAGNOSIS — G9341 Metabolic encephalopathy: Secondary | ICD-10-CM

## 2016-05-26 DIAGNOSIS — R9431 Abnormal electrocardiogram [ECG] [EKG]: Secondary | ICD-10-CM

## 2016-05-26 DIAGNOSIS — Z515 Encounter for palliative care: Secondary | ICD-10-CM

## 2016-05-26 DIAGNOSIS — E162 Hypoglycemia, unspecified: Secondary | ICD-10-CM

## 2016-05-26 LAB — CBC
HCT: 23.7 % — ABNORMAL LOW (ref 39.0–52.0)
HEMOGLOBIN: 8.1 g/dL — AB (ref 13.0–17.0)
MCH: 26.5 pg (ref 26.0–34.0)
MCHC: 34.2 g/dL (ref 30.0–36.0)
MCV: 77.5 fL — ABNORMAL LOW (ref 78.0–100.0)
PLATELETS: 143 10*3/uL — AB (ref 150–400)
RBC: 3.06 MIL/uL — AB (ref 4.22–5.81)
RDW: 18.3 % — ABNORMAL HIGH (ref 11.5–15.5)
WBC: 6.7 10*3/uL (ref 4.0–10.5)

## 2016-05-26 LAB — BPAM RBC
BLOOD PRODUCT EXPIRATION DATE: 201803312359
Blood Product Expiration Date: 201803292359
Blood Product Expiration Date: 201803312359
ISSUE DATE / TIME: 201803230509
ISSUE DATE / TIME: 201803260555
ISSUE DATE / TIME: 201803260948
UNIT TYPE AND RH: 6200
Unit Type and Rh: 600
Unit Type and Rh: 6200

## 2016-05-26 LAB — ECHOCARDIOGRAM COMPLETE
Height: 71 in
Weight: 2547.2 oz

## 2016-05-26 LAB — TYPE AND SCREEN
ABO/RH(D): A POS
ANTIBODY SCREEN: NEGATIVE
UNIT DIVISION: 0
UNIT DIVISION: 0
Unit division: 0

## 2016-05-26 LAB — COMPREHENSIVE METABOLIC PANEL
ALK PHOS: 304 U/L — AB (ref 38–126)
ALT: 42 U/L (ref 17–63)
ANION GAP: 12 (ref 5–15)
AST: 152 U/L — ABNORMAL HIGH (ref 15–41)
Albumin: 2.1 g/dL — ABNORMAL LOW (ref 3.5–5.0)
BUN: 92 mg/dL — ABNORMAL HIGH (ref 6–20)
CHLORIDE: 104 mmol/L (ref 101–111)
CO2: 19 mmol/L — AB (ref 22–32)
Calcium: 7 mg/dL — ABNORMAL LOW (ref 8.9–10.3)
Creatinine, Ser: 1.54 mg/dL — ABNORMAL HIGH (ref 0.61–1.24)
GFR, EST AFRICAN AMERICAN: 46 mL/min — AB (ref >60)
GFR, EST NON AFRICAN AMERICAN: 40 mL/min — AB (ref >60)
Glucose, Bld: 120 mg/dL — ABNORMAL HIGH (ref 65–99)
Potassium: 5.4 mmol/L — ABNORMAL HIGH (ref 3.5–5.1)
SODIUM: 135 mmol/L (ref 135–145)
Total Bilirubin: 3.7 mg/dL — ABNORMAL HIGH (ref 0.3–1.2)
Total Protein: 6.1 g/dL — ABNORMAL LOW (ref 6.5–8.1)

## 2016-05-26 LAB — LACTIC ACID, PLASMA
LACTIC ACID, VENOUS: 2.8 mmol/L — AB (ref 0.5–1.9)
Lactic Acid, Venous: 3.5 mmol/L (ref 0.5–1.9)

## 2016-05-26 LAB — GLUCOSE, CAPILLARY
GLUCOSE-CAPILLARY: 116 mg/dL — AB (ref 65–99)
GLUCOSE-CAPILLARY: 117 mg/dL — AB (ref 65–99)
Glucose-Capillary: 86 mg/dL (ref 65–99)

## 2016-05-26 LAB — URINE CULTURE: Culture: NO GROWTH

## 2016-05-26 LAB — TSH: TSH: 2.464 u[IU]/mL (ref 0.350–4.500)

## 2016-05-26 LAB — PATHOLOGIST SMEAR REVIEW

## 2016-05-26 LAB — CORTISOL: Cortisol, Plasma: >100 ug/dL

## 2016-05-26 LAB — CORTISOL-AM, BLOOD

## 2016-05-26 LAB — TROPONIN I: Troponin I: 2.27 ng/mL (ref <0.03)

## 2016-05-26 LAB — PREALBUMIN: Prealbumin: <5 mg/dL — ABNORMAL LOW (ref 18–38)

## 2016-05-26 LAB — HAPTOGLOBIN: Haptoglobin: 180 mg/dL (ref 34–200)

## 2016-05-26 MED ORDER — CARVEDILOL 3.125 MG PO TABS
3.1250 mg | ORAL_TABLET | Freq: Two times a day (BID) | ORAL | Status: DC
  Administered 2016-05-26: 3.125 mg via ORAL
  Filled 2016-05-26: qty 1

## 2016-05-26 MED ORDER — GI COCKTAIL ~~LOC~~: 30.0000 mL | Freq: Three times a day (TID) | ORAL | Status: DC | PRN

## 2016-05-26 MED ORDER — SODIUM CHLORIDE 0.9 % IV SOLN
1.0000 g | Freq: Once | INTRAVENOUS | Status: AC
  Administered 2016-05-26: 1 g via INTRAVENOUS
  Filled 2016-05-26 (×2): qty 10

## 2016-05-26 MED ORDER — MEGESTROL ACETATE 40 MG PO TABS
40.0000 mg | ORAL_TABLET | Freq: Every day | ORAL | Status: DC
  Administered 2016-05-26: 40 mg via ORAL
  Filled 2016-05-26: qty 1

## 2016-05-26 MED ORDER — ALBUTEROL SULFATE (2.5 MG/3ML) 0.083% IN NEBU: 2.5000 mg | INHALATION_SOLUTION | RESPIRATORY_TRACT | Status: DC | PRN

## 2016-05-26 MED ORDER — TAMSULOSIN HCL 0.4 MG PO CAPS
0.4000 mg | ORAL_CAPSULE | Freq: Every day | ORAL | Status: DC
  Administered 2016-05-26: 0.4 mg via ORAL
  Filled 2016-05-26: qty 1

## 2016-05-26 MED ORDER — GLYCOPYRROLATE 0.2 MG/ML IJ SOLN
0.4000 mg | Freq: Three times a day (TID) | INTRAMUSCULAR | Status: DC
  Administered 2016-05-26: 0.4 mg via INTRAVENOUS
  Filled 2016-05-26: qty 2

## 2016-05-26 MED ORDER — FERUMOXYTOL INJECTION 510 MG/17 ML
510.0000 mg | INTRAVENOUS | Status: DC
  Administered 2016-05-26: 510 mg via INTRAVENOUS
  Filled 2016-05-26: qty 17

## 2016-05-26 MED ORDER — SODIUM CHLORIDE 0.9 % IV BOLUS (SEPSIS): 500.0000 mL | Freq: Once | INTRAVENOUS | Status: DC

## 2016-05-26 MED ORDER — PANTOPRAZOLE SODIUM 40 MG PO TBEC
40.0000 mg | DELAYED_RELEASE_TABLET | Freq: Two times a day (BID) | ORAL | Status: DC
  Administered 2016-05-26: 40 mg via ORAL
  Filled 2016-05-26: qty 1

## 2016-05-26 MED ORDER — LORAZEPAM 1 MG PO TABS: 1.0000 mg | ORAL_TABLET | ORAL | Status: DC | PRN

## 2016-05-26 MED ORDER — METOLAZONE 5 MG PO TABS
2.5000 mg | ORAL_TABLET | Freq: Once | ORAL | Status: AC
  Administered 2016-05-26: 2.5 mg via ORAL
  Filled 2016-05-26: qty 1

## 2016-05-26 MED ORDER — SODIUM CHLORIDE 0.9 % IV BOLUS (SEPSIS)
500.0000 mL | Freq: Once | INTRAVENOUS | Status: AC
  Administered 2016-05-26: 500 mL via INTRAVENOUS

## 2016-05-26 MED ORDER — MORPHINE SULFATE (CONCENTRATE) 10 MG/0.5ML PO SOLN
5.0000 mg | ORAL | Status: DC | PRN
  Administered 2016-05-26 (×2): 5 mg via ORAL
  Filled 2016-05-26 (×2): qty 0.5

## 2016-05-26 MED ORDER — SORBITOL 70 % SOLN
960.0000 mL | TOPICAL_OIL | Freq: Once | ORAL | Status: DC
  Filled 2016-05-26: qty 240

## 2016-05-26 MED ORDER — ASPIRIN 81 MG PO CHEW
81.0000 mg | CHEWABLE_TABLET | Freq: Every day | ORAL | Status: DC
  Administered 2016-05-26: 81 mg via ORAL
  Filled 2016-05-26: qty 1

## 2016-05-26 MED ORDER — FUROSEMIDE 10 MG/ML IJ SOLN
60.0000 mg | Freq: Two times a day (BID) | INTRAMUSCULAR | Status: DC
  Administered 2016-05-26 (×2): 60 mg via INTRAVENOUS
  Filled 2016-05-26 (×2): qty 6

## 2016-05-26 MED ORDER — SODIUM POLYSTYRENE SULFONATE 15 GM/60ML PO SUSP
30.0000 g | Freq: Once | ORAL | Status: AC
  Administered 2016-05-26: 30 g via ORAL
  Filled 2016-05-26: qty 120

## 2016-05-26 NOTE — Progress Notes (Signed)
  Echocardiogram 2D Echocardiogram has been performed.  Tye SavoyCasey N Sencere Symonette 04/30/2016, 12:21 PM

## 2016-05-26 NOTE — Consult Note (Signed)
Consultation Note Date: 05/30/2016   Patient Name: Brady Simpson  DOB: 10-Jun-1930  MRN: [id number]  Age / Sex: 83 y.o., male  PCP: No Pcp Per Patient Referring Physician: Leroy Sea, MD  Reason for Consultation: Establishing goals of care and Psychosocial/spiritual support  HPI/Patient Profile: 83 y.o. male   admitted on 06/14/16 with  medical history significant for prior cataract surgery as well as remote utilization of tobacco and alcohol with cessation in 2012.   Patient was brought to the emergency department by EMS after the son was called to the home for altered mental status. Initial CBG at the scene was 33 and a D10 infusion as well as a D50 bolus was given.   Continued physical and functional decline over the past 2 months, this hospital stay significant for NSTEMI, CHF, AKI, Hypoglycemia  The granddaughter reports he is only drinking ensure and has refused to be taken to any physician or to the hospital.  He has avoided doctors his whole life.  Overall failure to thrive, family face advanced directive decisions and anticipatory care needs   Clinical Assessment and Goals of Care:  This NP Lorinda Creed reviewed medical records, received report from team, assessed the patient and then meet at the patient's bedside along with his large family to include, son/Mike Bergevin/HPOA,  Christoper Fabian      to discuss diagnosis, prognosis, GOC, EOL wishes disposition and options.  A detailed discussion was had today regarding advanced directives.  Concepts specific to code status, artifical feeding and hydration, continued IV antibiotics and rehospitalization was had.  The difference between a aggressive medical intervention path  and a palliative comfort care path for this patient at this time was had.  Values and goals of care important to patient and family were attempted to be elicited.  Concept  of Hospice and Palliative Care were discussed  Natural trajectory and expectations at EOL were discussed.  Questions and concerns addressed.   Family encouraged to call with questions or concerns.  PMT will continue to support holistically.   HCPOA/ Kathlene November Manka/son    SUMMARY OF RECOMMENDATIONS    Code Status/Advance Care Planning:  DNR   Symptom Management:   Pain/Dyspnea:  Roxanol 5 mg every 1 hr po/sl   Agitation: Ativan 1 mg po/sl every 4 hrs prn  Terminal secretion: Robinol   Palliative Prophylaxis:   Aspiration, Bowel Regimen, Delirium Protocol, Frequent Pain Assessment and Palliative Wound Care  Additional Recommendations (Limitations, Scope, Preferences):  Full Comfort Care  Psycho-social/Spiritual:   Desire for further Chaplaincy support:no  Additional Recommendations: Education on Hospice  Prognosis:   Hours - Days  Discharge Planning: Anticipated Hospital Death      Primary Diagnoses: Present on Admission: . NSTEMI (non-ST elevated myocardial infarction) (HCC) . Deep tissue injury/sacrum . Symptomatic anemia . Iron deficiency anemia . Elevated brain natriuretic peptide (BNP) level . Acute metabolic encephalopathy due to hypoglycemia . Hypothermia . Quit consuming alcohol in remote past (2012) . FTT (failure to thrive) in adult . Transaminitis .  Constipation . Severe protein-calorie malnutrition (HCC) . Acute urinary retention   I have reviewed the medical record, interviewed the patient and family, and examined the patient. The following aspects are pertinent.  Past Medical History:  Diagnosis Date  . Cardiomegaly   . Failure to thrive (0-17) 04/2016  . Iron deficiency anemia   . NSTEMI (non-ST elevated myocardial infarction) (HCC) 2018   Social History   Social History  . Marital status: Married    Spouse name: N/A  . Number of children: N/A  . Years of education: N/A   Social History Main Topics  . Smoking status: Former  Smoker    Types: Cigarettes    Quit date: 10/16/2012  . Smokeless tobacco: Never Used  . Alcohol use No     Comment: Quit 2012  . Drug use: No  . Sexual activity: No   Other Topics Concern  . None   Social History Narrative  . None   Family History  Problem Relation Age of Onset  . Diabetes Sister   . Hypertension Brother    Scheduled Meds: . aspirin  81 mg Oral Daily  . calcium gluconate  1 g Intravenous Once  . carvedilol  3.125 mg Oral BID WC  . docusate sodium  100 mg Oral BID  . feeding supplement  1 Container Oral TID BM  . feeding supplement (ENSURE ENLIVE)  237 mL Oral BID BM  . ferumoxytol  510 mg Intravenous Weekly  . furosemide  60 mg Intravenous BID  . megestrol  40 mg Oral Daily  . pantoprazole  40 mg Oral BID  . sorbitol, milk of mag, mineral oil, glycerin (SMOG) enema  960 mL Rectal Once  . tamsulosin  0.4 mg Oral Daily   Continuous Infusions: PRN Meds:.albuterol, bisacodyl, gi cocktail, [DISCONTINUED] ondansetron **OR** ondansetron (ZOFRAN) IV, polyethylene glycol Medications Prior to Admission:  Prior to Admission medications   Not on File   No Known Allergies Review of Systems  Unable to perform ROS: Mental status change    Physical Exam  Constitutional: He appears lethargic. He appears cachectic. He appears ill.  Cardiovascular: Normal rate, regular rhythm and normal heart sounds.   - both feet mottled up to the ankles, cool  Pulmonary/Chest: He has decreased breath sounds in the right lower field and the left lower field.  Abdominal: He exhibits distension and ascites.  Neurological: He appears lethargic.  Skin: Skin is warm and dry.    Vital Signs: BP 117/82 (BP Location: Right Arm)   Pulse 77   Temp 98 F (36.7 C) (Oral)   Resp 18   Ht 5\' 11"  (1.803 m)   Wt 72.2 kg (159 lb 3.2 oz)   SpO2 97%   BMI 22.20 kg/m  Pain Assessment: No/denies pain   Pain Score: 0-No pain   SpO2: SpO2: 97 % O2 Device:SpO2: 97 % O2 Flow Rate: .O2  Flow Rate (L/min): 2 L/min  IO: Intake/output summary:  Intake/Output Summary (Last 24 hours) at June 09, 2016 1020 Last data filed at 2016/06/09 0700  Gross per 24 hour  Intake           1363.1 ml  Output              100 ml  Net           1263.1 ml    LBM: Last BM Date: 05/11/2016 Baseline Weight: Weight: 73.4 kg (161 lb 12.8 oz) Most recent weight: Weight: 72.2 kg (159 lb 3.2 oz)  Palliative Assessment/Data: 20% at best    Later in the afternoon meet with many other family members to again discuss GOC and options.  We discussed most recent ECHO and abdominal CT.     A detailed discussion was had today regarding advanced directives.  Concepts specific to code status, artifical feeding and hydration, continued IV antibiotics and rehospitalization was had.  The difference between a aggressive medical intervention path  and a palliative comfort care path for this patient at this time was had.  Values and goals of care important to patient and family were attempted to be elicited.   Time In: 0830       Time Out: 0945  Time In:  1420        Time out:  1330  Time Total:  135 minutes  Greater than 50%  of this time was spent counseling and coordinating care related to the above assessment and plan.  Signed by: Lorinda CreedLARACH, Nancylee Gaines, NP   Please contact Palliative Medicine Team phone at [phone] for questions and concerns.  For individual provider: See Loretha StaplerAmion

## 2016-05-26 NOTE — Evaluation (Signed)
Clinical/Bedside Swallow Evaluation Patient Details  Name: Brady Simpson MRN: [id number] Date of Birth: Aug 13, 1930  Today's Date: 05/07/2016 Time: SLP Start Time (ACUTE ONLY): 1221 SLP Stop Time (ACUTE ONLY): 1234 SLP Time Calculation (min) (ACUTE ONLY): 13 min  Past Medical History:  Past Medical History:  Diagnosis Date  . Cardiomegaly   . Failure to thrive (0-17) 04/2016  . Iron deficiency anemia   . NSTEMI (non-ST elevated myocardial infarction) (HCC) 2018   Past Surgical History:  Past Surgical History:  Procedure Laterality Date  . CATARACT EXTRACTION, BILATERAL  1999   HPI:  83 y.o.malewith medical history significant for prior cataract surgery as well as remote utilization of tobacco and alcohol with cessation in 2012 admitted with hypoglycemia, anasarca, NSTEMI, severe constipation, sacral decub.    Assessment / Plan / Recommendation Clinical Impression  Pt with minimal appetite, but willing to consume limited POs for purposes of assessment.  No focal CN deficits.  No issues with ventilatory/swallowing coordination.  Adequate mastication, brisk swallow response, no overt s/s of aspiration.  No oropharyngeal dysphagia.  Continue current diet - give meds crushed in puree if problems occur with thin liquids/pills.  SLP to sign off.  SLP Visit Diagnosis: Dysphagia, unspecified (R13.10)    Aspiration Risk  No limitations    Diet Recommendation   regular diet, thin liquids  Medication Administration: Crushed with puree    Other  Recommendations Oral Care Recommendations: Oral care BID   Follow up Recommendations None      Frequency and Duration            Prognosis        Swallow Study   General HPI: 83 y.o.malewith medical history significant for prior cataract surgery as well as remote utilization of tobacco and alcohol with cessation in 2012 admitted with hypoglycemia, anasarca, NSTEMI, severe constipation, sacral decub.  Type of Study: Bedside Swallow  Evaluation Previous Swallow Assessment: no Diet Prior to this Study: Regular;Thin liquids Temperature Spikes Noted: No Respiratory Status: Nasal cannula History of Recent Intubation: No Behavior/Cognition: Alert Oral Cavity Assessment: Within Functional Limits Oral Care Completed by SLP: No Oral Cavity - Dentition: Missing dentition Vision: Functional for self-feeding Self-Feeding Abilities: Needs assist Patient Positioning: Upright in bed Baseline Vocal Quality: Normal Volitional Cough: Strong Volitional Swallow: Able to elicit    Oral/Motor/Sensory Function Overall Oral Motor/Sensory Function: Within functional limits   Ice Chips Ice chips: Within functional limits   Thin Liquid Thin Liquid: Within functional limits Presentation: Cup    Nectar Thick Nectar Thick Liquid: Not tested   Honey Thick Honey Thick Liquid: Not tested   Puree Puree: Within functional limits   Solid   GO   Solid: Not tested (pt. declined)        Blenda Mountsouture, Isabella Ida Laurice 05/28/2016,1:15 PM

## 2016-05-26 NOTE — Progress Notes (Signed)
PROGRESS NOTE                                                                                                                                                                                                             [name], is a 83 y.o. male, DOB - 12-14-1930, ONG:[id number]  Admit date - 05/21/2016   Admitting Physician Lorretta HarpXilin Niu, MD  Outpatient Primary MD for the patient is No PCP Per Patient  LOS - 1  Chief Complaint  Patient presents with  . Hypoglycemia  . Altered Mental Status       Brief Narrative   Brady Simpson is a 83 y.o. male with medical history significant for prior cataract surgery as well as remote utilization of tobacco and alcohol with cessation in 2012. Patient was brought to the emergency department by EMS after the son was called to the home for altered mental status. Initial CBG at the scene was 33, Further history revealed that patient has not been eating or drinking well for the last 1-2 months, he has lost considerable amounts of weight, has been generally weak and bed bound for 2 months, has developed swelling in his lower extremities. In the ER he was found to be severely anemic, had 2+ edema, was short of breath and was admitted for further care.   Subjective:    Brady Simpson today has, No headache, No chest pain, No abdominal pain - No Nausea, No new weakness tingling or numbness, +ve Cough & SOB.     Assessment  & Plan :     Generalized weakness, generalized edema, shortness of breath, unintentional weight loss, failure to thrive, severe protein calorie malnutrition. At this time had detailed discussions with patient's family bedside, goal is gentle medical treatment, minimal noninvasive investigation which will include an echocardiogram, CEA, UA and CMP. No heroic measures he will be DO NOT RESUSCITATE. No chemotherapy, radiation or surgical procedures.   1.  Hypoglycemia on admission. Likely due to poor oral intake, underlying malignancy cannot be ruled out, will check A1c, CBGs every 4 hours, place on nutritional supplementation, check TSH and cortisol. Gentle supportive care only and no heroics. He is DO NOT RESUSCITATE and palliative care has also been consulted for long-term goals of care. PT from tomorrow.  2. Anasarca. Due to combination of  severe malnutrition with low albumin along with underlying acute on chronic CHF. No previous echo on file. He has had no medical care for over 25-30 years. He will be placed on TED stockings, diuretics, salt and fluid restriction, have added low-dose Coreg as tolerated by blood pressure, obtain echocardiogram for diagnostic purposes only. Monitor intake and output and daily weight. UA is unremarkable.  3. NSTEMI. Not a candidate for invasive testing or procedures, he is on low-dose aspirin and Coreg, check echocardiogram to evaluate EF and wall motion however this is only for diagnostic purposes. Per family request cardiology input for medical management only.  4. Severe constipation. Based on bowel regimen including SMOG enema.  5. ARF with hyperkalemia. Kayexalate, currently is frankly fluid overloaded, diurese and monitor renal function.  6. Severe iron deficiency anemia. Hemoccult blood negative, this likely is chronic subtle GI tract blood loss over several years, received 2 units of packed RBC in the ER, post transfusion H&H is stable, we will place him on PPI along with iron supplement IV. Monitor H&H. Will check CEA as he never had colonoscopy. Family has been told that any diagnostic testing is for prognostic purposes only. He is not a candidate for invasive testing or procedures.  7. Acute urinary retention in the ER. Place Foley, Flomax and monitor.  8. Elevated pro-calcitonin. Nonspecific. No obvious signs of infection, he has no leukocytosis or fever, chest x-ray UA unremarkable, monitor for any signs  of active infection clinically.  9. Sacral decubitus ulcer. Kindly see wound care note for details. This was present on admission.  10. Elevated liver enzymes. Could be hepatic congestion, he does have history of alcohol abuse in the past, check right upper quadrant ultrasound and trend liver enzymes.  11. Hypothermia on admission. Likely due to combination of severe malnutrition. Poor oral intake, generalized deconditioning, low muscle mass. Currently resolved with supportive care. No signs of active infection or sepsis, check TSH and baseline cortisol and monitor.    Diet : DIET SOFT Room service appropriate? Yes; Fluid consistency: Thin    Family Communication  :  Son, daughter in law  Code Status :  DNR  Disposition Plan  :  TBD  Consults  :  Pall care  Procedures  :    TTE  RUQ US   DVT Prophylaxis  :   SCDs    Lab Results  Component Value Date   PLT 143 (L) 2016-06-01    Inpatient Medications  Scheduled Meds: . aspirin  81 mg Oral Daily  . carvedilol  3.125 mg Oral BID WC  . docusate sodium  100 mg Oral BID  . feeding supplement  1 Container Oral TID BM  . feeding supplement (ENSURE ENLIVE)  237 mL Oral BID BM  . furosemide  60 mg Intravenous BID  . sorbitol, milk of mag, mineral oil, glycerin (SMOG) enema  960 mL Rectal Once   Continuous Infusions: PRN Meds:.albuterol, bisacodyl, [DISCONTINUED] ondansetron **OR** ondansetron (ZOFRAN) IV, polyethylene glycol  Antibiotics  :    Anti-infectives    None         Objective:   Vitals:   June 01, 2016 0352 06-01-2016 0354 06-01-16 0359 06/01/16 0800  BP:  99/78  117/82  Pulse:  90  77  Resp: (!) 24   18  Temp: 98.8 F (37.1 C)   98 F (36.7 C)  TempSrc: Oral   Oral  SpO2: 100%   97%  Weight:   72.2 kg (159 lb 3.2 oz)  Height:        Wt Readings from Last 3 Encounters:  05/22/2016 72.2 kg (159 lb 3.2 oz)     Intake/Output Summary (Last 24 hours) at 05/05/2016 0907 Last data filed at 05/17/2016 0700   Gross per 24 hour  Intake           1698.1 ml  Output              300 ml  Net           1398.1 ml     Physical Exam  Awake , No new F.N deficits, Normal affect [place].AT,PERRAL Supple Neck,No JVD, No cervical lymphadenopathy appriciated.  Symmetrical Chest wall movement, Good air movement bilaterally, CTAB RRR,No Gallops,Rubs or new Murmurs, No Parasternal Heave +ve B.Sounds, Abd Soft, No tenderness, No organomegaly appriciated, No rebound - guarding or rigidity. No Cyanosis, Clubbing , 3+ ankle edema, No new Rash or bruise       Data Review:    CBC  Recent Labs Lab 05/22/2016 0510 05/20/2016 0515 05/02/2016 1811 05/23/2016 0156  WBC 5.8  --  7.8 6.7  HGB 5.4* 6.1* 8.9* 8.1*  HCT 17.1* 18.0* 26.4* 23.7*  PLT 206  --  71* 143*  MCV 77.0*  --  79.0 77.5*  MCH 24.3*  --  26.6 26.5  MCHC 31.6  --  33.7 34.2  RDW 19.0*  --  17.9* 18.3*  LYMPHSABS 0.7  --   --   --   MONOABS 0.2  --   --   --   EOSABS 0.0  --   --   --   BASOSABS 0.0  --   --   --     Chemistries   Recent Labs Lab 05/06/2016 0510 05/24/2016 0515 05/17/2016 1448 05/16/2016 0156  NA 136 137  --  135  K 4.7 4.7  --  5.4*  CL 104 105  --  104  CO2 20*  --   --  19*  GLUCOSE 148* 149*  --  120*  BUN 81* 76*  --  92*  CREATININE 1.24 1.50*  --  1.54*  CALCIUM 7.4*  --   --  7.0*  MG  --   --  2.3  --   AST 130*  --   --  152*  ALT 26  --   --  42  ALKPHOS 111  --   --  304*  BILITOT 1.2  --   --  3.7*   ------------------------------------------------------------------------------------------------------------------ No results for input(s): CHOL, HDL, LDLCALC, TRIG, CHOLHDL, LDLDIRECT in the last 72 hours.  No results found for: HGBA1C ------------------------------------------------------------------------------------------------------------------  Recent Labs  05/05/2016 0510  TSH 2.205    ------------------------------------------------------------------------------------------------------------------  Recent Labs  05/10/2016 0510  VITAMINB12 4,200*  FOLATE 19.1  FERRITIN 97  TIBC 188*  IRON 11*  RETICCTPCT 1.0    Coagulation profile  Recent Labs Lab 05/16/2016 0510  INR 2.09    No results for input(s): DDIMER in the last 72 hours.  Cardiac Enzymes  Recent Labs Lab 05/16/2016 1811 05/14/2016 0155  TROPONINI 1.64* 2.27*   ------------------------------------------------------------------------------------------------------------------    Component Value Date/Time   BNP >4,500.0 (H) 05/12/2016 0510    Micro Results Recent Results (from the past 240 hour(s))  Blood culture (routine x 2)     Status: None (Preliminary result)   Collection Time: 05/24/2016  5:20 AM  Result Value Ref Range Status   Specimen Description BLOOD RIGHT HAND  Final   Special  Requests IN PEDIATRIC BOTTLE 3CC  Final   Culture NO GROWTH < 12 HOURS  Final   Report Status PENDING  Incomplete  Blood culture (routine x 2)     Status: None (Preliminary result)   Collection Time: 14-Jun-2016  5:24 AM  Result Value Ref Range Status   Specimen Description BLOOD LEFT ARM  Final   Special Requests BOTTLES DRAWN AEROBIC AND ANAEROBIC 5CC EA  Final   Culture NO GROWTH < 12 HOURS  Final   Report Status PENDING  Incomplete  MRSA PCR Screening     Status: None   Collection Time: 06-14-16  7:57 PM  Result Value Ref Range Status   MRSA by PCR NEGATIVE NEGATIVE Final    Comment:        The GeneXpert MRSA Assay (FDA approved for NASAL specimens only), is one component of a comprehensive MRSA colonization surveillance program. It is not intended to diagnose MRSA infection nor to guide or monitor treatment for MRSA infections.     Radiology Reports Portable Chest 1 View  Result Date: 05/15/2016 CLINICAL DATA:  Shortness of Breath EXAM: PORTABLE CHEST 1 VIEW COMPARISON:  June 14, 2016 FINDINGS:  Cardiac shadow is enlarged but stable. The overall inspiratory effort is poor with bibasilar atelectatic changes mild vascular congestion is also noted likely accentuated by the poor inspiration. No new focal abnormality is seen. IMPRESSION: Poor inspiratory effort with bibasilar changes slightly worse on the left than the right. Electronically Signed   By: Alcide Clever M.D.   On: 05/15/2016 07:38   Dg Chest Port 1 View  Result Date: 06/14/2016 CLINICAL DATA:  Weakness. Altered mental status tonight. Weight loss. EXAM: PORTABLE CHEST 1 VIEW COMPARISON:  None. FINDINGS: Lung volumes are low. Multiple overlying monitoring devices. Mild cardiomegaly. Atherosclerosis of the thoracic aorta. Hazy lung base opacities suggest pleural effusions and bibasilar airspace disease. Mild vascular congestion. Streaky left midlung opacity. No pneumothorax. No evidence of acute osseous abnormality. IMPRESSION: Mild cardiomegaly. Hazy lung base opacities suggest pleural effusions and bibasilar airspace disease, likely atelectasis. Mild vascular congestion. Findings may reflect fluid overload/ CHF. Electronically Signed   By: Rubye Oaks M.D.   On: 2016/06/14 05:34   Dg Abd Portable 1v  Result Date: 05/11/2016 CLINICAL DATA:  Constipation. EXAM: PORTABLE ABDOMEN - 1 VIEW COMPARISON:  No recent prior . FINDINGS: Soft tissue structures are unremarkable. Gas pattern nonspecific. Prominent stool volume. Constipation cannot be excluded. Bibasilar subsegmental atelectasis and/or infiltrates. Cardiomegaly . IMPRESSION: 1. Prominent stool volume. Constipation cannot be excluded. No bowel distention. 2. Bibasilar subsegmental atelectasis and or infiltrates. Cardiomegaly. Electronically Signed   By: Maisie Fus  Register   On: 05/18/2016 08:30    Time Spent in minutes  30   Susa Raring K M.D on 05/17/2016 at 9:07 AM  Between 7am to 7pm - Pager - [phone] ( page via [facility], text pages only, please mention full 10 digit call  back number).  After 7pm go to [email or web address] - password [facility]  [facility] -  Office  [phone]

## 2016-05-26 NOTE — Progress Notes (Signed)
[facility] ordered 500mL bolus of normal saline to infuse at 53600ml/hr due to patient's elevated lactic acid.  RN text paged [facility] to make sure infusion rate was correct since patient BNP on admission was greater than 4000 and patient has heart failure.

## 2016-05-26 NOTE — Plan of Care (Signed)
Problem: Education: Goal: Knowledge of [facility] Education information/materials will improve Outcome: Progressing Patient and patient's family who were at bedside earlier this shift are aware of plan of care.  Patient has denied pain thus far this shift.

## 2016-05-26 NOTE — Progress Notes (Signed)
Progress Note  Patient Name: Brady Simpson Date of Encounter: 05/13/2016  Primary Cardiologist: new Dr. Judie Petit. Sunni Richardson   Subjective   No chest pain or SOB, feels better, did have some rt foot pain.  Inpatient Medications    Scheduled Meds: . aspirin  81 mg Oral Daily  . calcium gluconate  1 g Intravenous Once  . carvedilol  3.125 mg Oral BID WC  . docusate sodium  100 mg Oral BID  . feeding supplement  1 Container Oral TID BM  . feeding supplement (ENSURE ENLIVE)  237 mL Oral BID BM  . ferumoxytol  510 mg Intravenous Weekly  . furosemide  60 mg Intravenous BID  . megestrol  40 mg Oral Daily  . pantoprazole  40 mg Oral BID  . sorbitol, milk of mag, mineral oil, glycerin (SMOG) enema  960 mL Rectal Once  . tamsulosin  0.4 mg Oral Daily   Continuous Infusions:  PRN Meds: albuterol, bisacodyl, gi cocktail, [DISCONTINUED] ondansetron **OR** ondansetron (ZOFRAN) IV, polyethylene glycol   Vital Signs    Vitals:   05/29/2016 0352 05/05/2016 0354 05/12/2016 0359 05/10/2016 0800  BP:  99/78  117/82  Pulse:  90  77  Resp: (!) 24   18  Temp: 98.8 F (37.1 C)   98 F (36.7 C)  TempSrc: Oral   Oral  SpO2: 100%   97%  Weight:   159 lb 3.2 oz (72.2 kg)   Height:        Intake/Output Summary (Last 24 hours) at 05/14/2016 1100 Last data filed at 05/24/2016 0700  Gross per 24 hour  Intake           1363.1 ml  Output              100 ml  Net           1263.1 ml   Filed Weights   02-Jun-2016 1428 05/23/2016 0359  Weight: 161 lb 12.8 oz (73.4 kg) 159 lb 3.2 oz (72.2 kg)    Telemetry    SR - Personally Reviewed  ECG    SR with LBBB poss ant MI  - Personally Reviewed  Physical Exam   GEN: No acute distress.  Just back from Echo Neck: mild JVD Cardiac: RRR, no murmurs, rubs, or gallops.  Respiratory: Clear to auscultation bilaterally.-ant GI: Soft, nontender, non-distended  MS: 2-3+ edema mid calf and below; No deformity. Neuro:  Nonfocal  Psych: Normal to flat affect   Labs     Chemistry Recent Labs Lab 2016-06-02 0510 06/02/2016 0515 05/17/2016 0156  NA 136 137 135  K 4.7 4.7 5.4*  CL 104 105 104  CO2 20*  --  19*  GLUCOSE 148* 149* 120*  BUN 81* 76* 92*  CREATININE 1.24 1.50* 1.54*  CALCIUM 7.4*  --  7.0*  PROT 6.2*  --  6.1*  ALBUMIN 2.0*  --  2.1*  AST 130*  --  152*  ALT 26  --  42  ALKPHOS 111  --  304*  BILITOT 1.2  --  3.7*  GFRNONAA 52*  --  40*  GFRAA >60  --  46*  ANIONGAP 12  --  12     Hematology Recent Labs Lab 02-Jun-2016 0510 06-02-2016 0515 06-02-2016 1811 05/15/2016 0156  WBC 5.8  --  7.8 6.7  RBC 2.22*  2.22*  --  3.34* 3.06*  HGB 5.4* 6.1* 8.9* 8.1*  HCT 17.1* 18.0* 26.4* 23.7*  MCV 77.0*  --  79.0 77.5*  MCH 24.3*  --  26.6 26.5  MCHC 31.6  --  33.7 34.2  RDW 19.0*  --  17.9* 18.3*  PLT 206  --  71* 143*    Cardiac Enzymes Recent Labs Lab 07-07-2016 1811 05/01/2016 0155  TROPONINI 1.64* 2.27*    Recent Labs Lab 07-07-2016 0513  TROPIPOC 2.23*     BNP Recent Labs Lab 07-07-2016 0510  BNP >4,500.0*     DDimer No results for input(s): DDIMER in the last 168 hours.   Radiology    Portable Chest 1 View  Result Date: 05/12/2016 CLINICAL DATA:  Shortness of Breath EXAM: PORTABLE CHEST 1 VIEW COMPARISON:  20-Apr-2016 FINDINGS: Cardiac shadow is enlarged but stable. The overall inspiratory effort is poor with bibasilar atelectatic changes mild vascular congestion is also noted likely accentuated by the poor inspiration. No new focal abnormality is seen. IMPRESSION: Poor inspiratory effort with bibasilar changes slightly worse on the left than the right. Electronically Signed   By: Alcide CleverMark  Lukens M.D.   On: 05/19/2016 07:38   Dg Chest Port 1 View  Result Date: Oct 05, 2016 CLINICAL DATA:  Weakness. Altered mental status tonight. Weight loss. EXAM: PORTABLE CHEST 1 VIEW COMPARISON:  None. FINDINGS: Lung volumes are low. Multiple overlying monitoring devices. Mild cardiomegaly. Atherosclerosis of the thoracic aorta. Hazy lung base  opacities suggest pleural effusions and bibasilar airspace disease. Mild vascular congestion. Streaky left midlung opacity. No pneumothorax. No evidence of acute osseous abnormality. IMPRESSION: Mild cardiomegaly. Hazy lung base opacities suggest pleural effusions and bibasilar airspace disease, likely atelectasis. Mild vascular congestion. Findings may reflect fluid overload/ CHF. Electronically Signed   By: Rubye OaksMelanie  Ehinger M.D.   On: 20-Apr-2016 05:34   Dg Abd Portable 1v  Result Date: 05/24/2016 CLINICAL DATA:  Constipation. EXAM: PORTABLE ABDOMEN - 1 VIEW COMPARISON:  No recent prior . FINDINGS: Soft tissue structures are unremarkable. Gas pattern nonspecific. Prominent stool volume. Constipation cannot be excluded. Bibasilar subsegmental atelectasis and/or infiltrates. Cardiomegaly . IMPRESSION: 1. Prominent stool volume. Constipation cannot be excluded. No bowel distention. 2. Bibasilar subsegmental atelectasis and or infiltrates. Cardiomegaly. Electronically Signed   By: Maisie Fushomas  Register   On: 05/03/2016 08:30    Cardiac Studies   Echo pending  Patient Profile     83 y.o. male who has not seen a doctor in many years, [facility] with altered mental status and hypoglycemia. He was found to be anemic that was symptomatic and hypothermic. Cardiology was consulted for elevated troponin and BNP.   Assessment & Plan    1. Elevated troponin/ NSTEMI - troponin 2.23-2.27,  - EKG without clear signs of ischemia - pt denies current and past chest pain - troponin elevation may be due to demand ischemia in the presence of systolic heart failure and elevated BNP - he is not a candidate for invasive intervention at this time- needs anemia evaluated - echo pending - on asa and BB, would need statin   2. CHF, unknown type / shortness of breath - BNP on admission > 4500 - echocardiogram pending - CXR with cardiomegaly and bilateral pleural effusions - pt is maintaining on room  air - he reports several months of shortness of breath -  IV lasix 60 mg BID --+ 1398 since admit with blood but wt is down from 161.12 lbs to 159   --echo pending  3. AKI - no previous records - sCr 1.50 (1.24 on admission); today 1.54  foley in place for exact  measurement - follow renal function in the presence of diuresis, daily BMP   4. Anemia - 2U PRBC given in ED;  Hb 6.1 (5.4) now 8.1  - per primary team   5. Hypoglycemia  - per primary team; now resolved   6. FTT - encouraged PO intake - family states he has only been eating sweets and occasional fast food because he doesn't like the cooking - family states they give him a bottle of water each day and he drink the water   7. End of Life Decisions: - per patient, he stated he wanted compressions and shock if his heart stops - currently DNR per ED provider - ask that the hospitalist/medicine team clarify code status    Signed, Nada Boozer, NP  05/03/2016, 11:00 AM    I have seen and examined the patient along with Nada Boozer, NP .  I have reviewed the chart, notes and new data.  I agree with NP's note.  ECHO today  - Left ventricle: The cavity size was normal. Wall thickness was normal. Systolic function was severely reduced. The estimated ejection fraction was 15%. Diffuse hypokinesis. The study is not technically sufficient to allow evaluation of LV diastolic function. - Aortic valve: Valve mobility was restricted. There was trivial regurgitation. - Mitral valve: Calcified annulus. There was moderate regurgitation. - Left atrium: The atrium was moderately dilated. - Right ventricle: Systolic function was moderately reduced. - Tricuspid valve: There was moderate regurgitation. - Pulmonary arteries: Systolic pressure was moderately increased.   PA peak pressure: 56 mm Hg (S). - Pericardium, extracardiac: There was a left pleural effusion.  PLAN: Severe cardiomyopathy - first order of business  would be to evaluate his coronaries, but other medical problems are holding this back. Continue diuretics. Premature to add beta blockers until closer to euvolemia. Unfortunately, if he does have CAD he is likely to have multivessel disease and is unlikely to be a CABG candidate due to poor functional status. Symptoms are better after transfusion. Our options are very limited until the cause of his iron deficiency anemia is clarified (one sample of stool negative for bleeding so far).  Palliative care approach needs to be followed in parallel.  Thurmon Fair, MD, [facility] [phone] 05/14/2016, 5:31 PM

## 2016-05-26 NOTE — Progress Notes (Signed)
PT Cancellation Note  Patient Details Name: Brady LappingRobert Blum MRN: [id number] DOB: [birth year]   Cancelled Treatment:    Reason Eval/Treat Not Completed: Patient not medically ready. Pt with elevated troponin at 2.2 and is SOB. PT to return as able.   Becker Christopher M Assata Juncaj 05/17/2016, 8:08 AM  Lewis ShockAshly Yuliana Vandrunen, PT, DPT Pager #: [phone] Office #: [phone]

## 2016-05-26 NOTE — Progress Notes (Signed)
Patient expired at 2203; absence of heart sounds, no respirations noted, two RN's verified.  Multiple family members at beside.  [facility] paged.

## 2016-05-26 NOTE — Progress Notes (Signed)
RN text paged [facility] with patient recent lactic acid result of 2.8.

## 2016-05-26 NOTE — Progress Notes (Signed)
CRITICAL VALUE ALERT  Critical value received:  Lactic Acid 3.5  Date of notification:  05/07/2016  Time of notification:  1520  Critical value read back:Yes.    Nurse who received alert:  Raymon MuttonGwen Aquinnah Devin RN  MD notified (1st page):  Dr Thedore MinsSingh  Time of first page:  1525  Responding MD: Dr Thedore MinsSingh   Time MD responded:  [phone]

## 2016-05-26 NOTE — Progress Notes (Signed)
MEDICATION RELATED CONSULT NOTE - INITIAL   Pharmacy Consult for fereheme    Indication: iron deficiency  No Known Allergies  Patient Measurements: Height: 5\' 11"  (180.3 cm) Weight: 159 lb 3.2 oz (72.2 kg) IBW/kg (Calculated) : 75.3  Vital Signs: Temp: 98 F (36.7 C) (03/27 0800) Temp Source: Oral (03/27 0800) BP: 117/82 (03/27 0800) Pulse Rate: 77 (03/27 0800) Intake/Output from previous day: 03/26 0701 - 03/27 0700 In: 1698.1 [P.O.:459; I.V.:520; Blood:335; IV Piggyback:384.1] Out: 300 [Urine:300] Intake/Output from this shift: No intake/output data recorded.  Labs:  Recent Labs  05/10/2016 0510 05/29/2016 0515 05/14/2016 1448 05/21/2016 1811 05-11-16 0156  WBC 5.8  --   --  7.8 6.7  HGB 5.4* 6.1*  --  8.9* 8.1*  HCT 17.1* 18.0*  --  26.4* 23.7*  PLT 206  --   --  71* 143*  CREATININE 1.24 1.50*  --   --  1.54*  MG  --   --  2.3  --   --   ALBUMIN 2.0*  --   --   --  2.1*  PROT 6.2*  --   --   --  6.1*  AST 130*  --   --   --  152*  ALT 26  --   --   --  42  ALKPHOS 111  --   --   --  304*  BILITOT 1.2  --   --   --  3.7*   Estimated Creatinine Clearance: 37.1 mL/min (A) (by C-G formula based on SCr of 1.54 mg/dL (H)).   Microbiology: Recent Results (from the past 720 hour(s))  Blood culture (routine x 2)     Status: None (Preliminary result)   Collection Time: 05/30/2016  5:20 AM  Result Value Ref Range Status   Specimen Description BLOOD RIGHT HAND  Final   Special Requests IN PEDIATRIC BOTTLE 3CC  Final   Culture NO GROWTH < 12 HOURS  Final   Report Status PENDING  Incomplete  Blood culture (routine x 2)     Status: None (Preliminary result)   Collection Time: 05/17/2016  5:24 AM  Result Value Ref Range Status   Specimen Description BLOOD LEFT ARM  Final   Special Requests BOTTLES DRAWN AEROBIC AND ANAEROBIC 5CC EA  Final   Culture NO GROWTH < 12 HOURS  Final   Report Status PENDING  Incomplete  Urine culture     Status: None   Collection Time: 05/11/2016   8:45 AM  Result Value Ref Range Status   Specimen Description URINE, CLEAN CATCH  Final   Special Requests NONE  Final   Culture NO GROWTH  Final   Report Status 27-Jul-2016 FINAL  Final  MRSA PCR Screening     Status: None   Collection Time: 05/01/2016  7:57 PM  Result Value Ref Range Status   MRSA by PCR NEGATIVE NEGATIVE Final    Comment:        The GeneXpert MRSA Assay (FDA approved for NASAL specimens only), is one component of a comprehensive MRSA colonization surveillance program. It is not intended to diagnose MRSA infection nor to guide or monitor treatment for MRSA infections.     Medical History: Past Medical History:  Diagnosis Date  . Cardiomegaly   . Failure to thrive (0-17) 04/2016  . Iron deficiency anemia   . NSTEMI (non-ST elevated myocardial infarction) [facility](HCC) 2018    Assessment/Plan 83 year old male with severe iron deficient anemia (iron level 11).  Hemoccult negative.   Given patient's anasarca and acute renal failure, MD requesting we use fereheme to limit extra volume.   Will give patient full dose consisting of 500mg  every week for two weeks given severity of anemia.   Sheppard Coil PharmD., BCPS Clinical Pharmacist Pager [phone] 2016/06/22 10:06 AM

## 2016-05-26 NOTE — Progress Notes (Signed)
Initial Nutrition Assessment  DOCUMENTATION CODES:   Severe malnutrition in context of chronic illness  INTERVENTION:   None at this time; patient is refusing all PO supplements; palliative care team meeting with patient's family today.  NUTRITION DIAGNOSIS:   Malnutrition related to chronic illness as evidenced by severe depletion of muscle mass, severe depletion of body fat, energy intake < or equal to 75% for > or equal to 1 month, severe fluid accumulation.  GOAL:   Patient will meet greater than or equal to 90% of their needs  MONITOR:   PO intake and overall goals of care  REASON FOR ASSESSMENT:   Consult Assessment of nutrition requirement/status  ASSESSMENT:   83 y.o.malewith PMH of cataract surgery, remote tobacco and alcohol use with cessation in 2012. Patient was brought to the ED by EMS after the son was called to the home for altered mental status. Initial CBG at the scene was 33, further history revealed that patient has not been eating or drinking well for the last 1-2 months, he has lost considerable amounts of weight, has been generally weak and bed bound for 2 months, has developed swelling in his lower extremities. In the ER he was found to be severely anemic, had 2+ edema, was short of breath and was admitted for further care.   Patient's family in room with patient during RD visit. They report that patient has been eating minimally for the past 1-2 months. He has lost weight, unsure of amount. Patient reports that he is not going to eat. Refuses Ensure and Boost supplements. He had a little bit of applesauce today. Nutrition-Focused physical exam completed. Findings are severe fat depletion, severe muscle depletion, and severe edema.  Abdominal ultrasound showed liver lesions with concern for metastatic disease.  Palliative Care Team is meeting with family today to discuss goals of care. S/P bedside swallow evaluation with SLP today, rec continue regular  consistency foods with thin liquids. Labs reviewed: potassium 5.4 (H), alk phos 304 (H), AST 152 (H), total bilirubin 3.7 (H) Medications reviewed and include colace, feraheme, lasix, megace, flomax.  Diet Order:  DIET SOFT Room service appropriate? Yes; Fluid consistency: Thin  Skin:  Wound (see comment) (DTI to sacrum)  Last BM:  3/26  Height:   Ht Readings from Last 1 Encounters:  05/22/2016 5' 11"  (1.803 m)    Weight:   Wt Readings from Last 1 Encounters:  05/01/2016 159 lb 3.2 oz (72.2 kg)    Ideal Body Weight:  78.2 kg  BMI:  Body mass index is 22.2 kg/m.  Estimated Nutritional Needs:   Kcal:  1900-2100  Protein:  100-110 gm  Fluid:  2.1 L  EDUCATION NEEDS:   No education needs identified at this time  Molli Barrows, [place], [place], [place] Pager [phone] After Hours Pager [phone]

## 2016-05-27 LAB — HEMOGLOBIN A1C
HEMOGLOBIN A1C: 5.7 % — AB (ref 4.8–5.6)
Mean Plasma Glucose: 117 mg/dL

## 2016-05-27 LAB — CEA: CEA: 193.4 ng/mL — ABNORMAL HIGH (ref 0.0–4.7)

## 2016-05-30 LAB — CULTURE, BLOOD (ROUTINE X 2)
CULTURE: NO GROWTH
Culture: NO GROWTH

## 2016-05-31 NOTE — Discharge Summary (Signed)
[facility] Death Note                                                                                                                                                                                               Brady Simpson ZOX:[id number] DOB: [birth year] DOA: May 31, 2016  PCP: No PCP Per Patient  Admit date: 05-31-2016  Date of Death: 2016-06-01     Time of Death: Jul 13, 2201   Pronounced By - RN  Notification: No PCP Per Patient notified of death of 06/02/2016   History of present illness:   Brady Simpson is a 83 y.o. male with a history - significant for prior cataract surgery as well as remote utilization of tobacco and alcohol with cessation in 07/14/10. Patient was brought to the emergency department by EMS after the son was called to the home for altered mental status. Initial CBG at the scene was 33, Further history revealed that patient has not been eating or drinking well for the last 1-2 months, he has lost considerable amounts of weight, has been generally weak and bed bound for 2 months, has developed swelling in his lower extremities. In the ER he was found to be severely anemic, had 2+ edema, was short of breath and was admitted for further care.  After detailed discussion with patient, family and Palliative care he was made full comfort care.  Final Diagnoses:  Cause if death - CHF  Signature  Leroy Sea M.D on 06/02/16 at 7:58 AM  [facility]  Office Phone -[phone]  Total clinical and documentation time for today Under 30 minutes   Last Note                                                                     PROGRESS NOTE  [name], is a  83 y.o. male, DOB - 04-28-1930, [id number]  Admit date - 05/13/2016   Admitting Physician Lorretta HarpXilin Niu, MD  Outpatient Primary MD for the patient is No PCP Per Patient  LOS - 2  Chief Complaint  Patient presents with  . Hypoglycemia  . Altered Mental Status       Brief Narrative   Brady Simpson is a 83 y.o. male with medical history significant for prior cataract surgery as well as remote utilization of tobacco and alcohol with cessation in 2012. Patient was brought to the emergency department by EMS after the son was called to the home for altered mental status. Initial CBG at the scene was 33, Further history revealed that patient has not been eating or drinking well for the last 1-2 months, he has lost considerable amounts of weight, has been generally weak and bed bound for 2 months, has developed swelling in his lower extremities. In the ER he was found to be severely anemic, had 2+ edema, was short of breath and was admitted for further care.   Subjective:    Brady Simpson today has, No headache, No chest pain, No abdominal pain - No Nausea, No new weakness tingling or numbness, +ve Cough & SOB.     Assessment  & Plan :     Generalized weakness, generalized edema, shortness of breath, unintentional weight loss, failure to thrive, severe protein calorie malnutrition. At this time had detailed discussions with patient's family bedside, goal is gentle medical treatment, minimal noninvasive investigation which will include an echocardiogram, CEA, UA and CMP. No heroic measures he will be DO NOT RESUSCITATE. No chemotherapy, radiation or surgical procedures.   1. Hypoglycemia on admission. Likely due to poor oral intake, underlying malignancy cannot be ruled out, will check A1c, CBGs every 4 hours, place on nutritional supplementation, check TSH and cortisol. Gentle supportive care only and no heroics. He is DO NOT RESUSCITATE and palliative care has also been consulted for long-term goals of  care. PT from tomorrow.  2. Anasarca. Due to combination of severe malnutrition with low albumin along with underlying acute on chronic CHF. No previous echo on file. He has had no medical care for over 25-30 years. He will be placed on TED stockings, diuretics, salt and fluid restriction, have added low-dose Coreg as tolerated by blood pressure, obtain echocardiogram for diagnostic purposes only. Monitor intake and output and daily weight. UA is unremarkable.  3. NSTEMI. Not a candidate for invasive testing or procedures, he is on low-dose aspirin and Coreg, check echocardiogram to evaluate EF and wall motion however this is only for diagnostic purposes. Per family request cardiology input for medical management only.  4. Severe constipation. Based on bowel regimen including SMOG enema.  5. ARF with hyperkalemia. Kayexalate, currently is frankly fluid overloaded, diurese and monitor renal function.  6. Severe iron deficiency anemia. Hemoccult blood negative, this likely is chronic subtle GI tract blood loss over several years, received 2 units of packed RBC in the ER, post transfusion H&H is stable, we will place him on PPI along with iron supplement IV. Monitor H&H. Will check CEA as he never had colonoscopy. Family has been told that any diagnostic testing is for prognostic purposes only. He is not a candidate for invasive testing or procedures.  7. Acute urinary retention in the ER. Place Foley, Flomax and monitor.  8. Elevated pro-calcitonin. Nonspecific. No obvious signs of infection, he has no leukocytosis  or fever, chest x-ray UA unremarkable, monitor for any signs of active infection clinically.  9. Sacral decubitus ulcer. Kindly see wound care note for details. This was present on admission.  10. Elevated liver enzymes. Could be hepatic congestion, he does have history of alcohol abuse in the past, check right upper quadrant ultrasound and trend liver enzymes.  11. Hypothermia on  admission. Likely due to combination of severe malnutrition. Poor oral intake, generalized deconditioning, low muscle mass. Currently resolved with supportive care. No signs of active infection or sepsis, check TSH and baseline cortisol and monitor.    Diet :      Family Communication  :  Son, daughter in law  Code Status :  DNR  Disposition Plan  :  TBD  Consults  :  Pall care  Procedures  :    TTE  RUQ US   DVT Prophylaxis  :   SCDs    Lab Results  Component Value Date   PLT 143 (L) 05/18/2016    Inpatient Medications  Scheduled Meds:  Continuous Infusions: PRN Meds:.  Antibiotics  :    Anti-infectives    None         Objective:   Vitals:   05/18/2016 0354 05/18/2016 0359 05/06/2016 0800 05/10/2016 1900  BP: 99/78  117/82   Pulse: 90  77 66  Resp:   18   Temp:   98 F (36.7 C) 97.3 F (36.3 C)  TempSrc:   Oral Oral  SpO2:   97% 97%  Weight:  72.2 kg (159 lb 3.2 oz)    Height:        Wt Readings from Last 3 Encounters:  05/29/2016 72.2 kg (159 lb 3.2 oz)    No intake or output data in the 24 hours ending 06/05/2016 0758   Physical Exam  Awake , No new F.N deficits, Normal affect [place].AT,PERRAL Supple Neck,No JVD, No cervical lymphadenopathy appriciated.  Symmetrical Chest wall movement, Good air movement bilaterally, CTAB RRR,No Gallops,Rubs or new Murmurs, No Parasternal Heave +ve B.Sounds, Abd Soft, No tenderness, No organomegaly appriciated, No rebound - guarding or rigidity. No Cyanosis, Clubbing , 3+ ankle edema, No new Rash or bruise       Data Review:    CBC  Recent Labs Lab 05/28/2016 0510 05/24/2016 0515 05/24/2016 1811 05/30/2016 0156  WBC 5.8  --  7.8 6.7  HGB 5.4* 6.1* 8.9* 8.1*  HCT 17.1* 18.0* 26.4* 23.7*  PLT 206  --  71* 143*  MCV 77.0*  --  79.0 77.5*  MCH 24.3*  --  26.6 26.5  MCHC 31.6  --  33.7 34.2  RDW 19.0*  --  17.9* 18.3*  LYMPHSABS 0.7  --   --   --   MONOABS 0.2  --   --   --   EOSABS 0.0  --   --   --   BASOSABS  0.0  --   --   --     Chemistries   Recent Labs Lab 05/30/2016 0510 05/28/2016 0515 04/30/2016 1448 05/16/2016 0156  NA 136 137  --  135  K 4.7 4.7  --  5.4*  CL 104 105  --  104  CO2 20*  --   --  19*  GLUCOSE 148* 149*  --  120*  BUN 81* 76*  --  92*  CREATININE 1.24 1.50*  --  1.54*  CALCIUM 7.4*  --   --  7.0*  MG  --   --  2.3  --   AST 130*  --   --  152*  ALT 26  --   --  42  ALKPHOS 111  --   --  304*  BILITOT 1.2  --   --  3.7*   ------------------------------------------------------------------------------------------------------------------ No results for input(s): CHOL, HDL, LDLCALC, TRIG, CHOLHDL, LDLDIRECT in the last 72 hours.  Lab Results  Component Value Date   HGBA1C 5.7 (H) 05/07/2016   ------------------------------------------------------------------------------------------------------------------  Recent Labs  05/25/2016 1426  TSH 2.464   ------------------------------------------------------------------------------------------------------------------  Recent Labs  05/28/2016 0510  VITAMINB12 4,200*  FOLATE 19.1  FERRITIN 97  TIBC 188*  IRON 11*  RETICCTPCT 1.0    Coagulation profile  Recent Labs Lab 05/15/2016 0510  INR 2.09    No results for input(s): DDIMER in the last 72 hours.  Cardiac Enzymes  Recent Labs Lab 05/10/2016 1811 05/10/2016 0155  TROPONINI 1.64* 2.27*   ------------------------------------------------------------------------------------------------------------------    Component Value Date/Time   BNP >4,500.0 (H) 05/17/2016 0510    Micro Results Recent Results (from the past 240 hour(s))  Blood culture (routine x 2)     Status: None (Preliminary result)   Collection Time: 05/07/2016  5:20 AM  Result Value Ref Range Status   Specimen Description BLOOD RIGHT HAND  Final   Special Requests IN PEDIATRIC BOTTLE 3CC  Final   Culture NO GROWTH 1 DAY  Final   Report Status PENDING  Incomplete  Blood culture (routine x 2)      Status: None (Preliminary result)   Collection Time: 05/18/2016  5:24 AM  Result Value Ref Range Status   Specimen Description BLOOD LEFT ARM  Final   Special Requests BOTTLES DRAWN AEROBIC AND ANAEROBIC 5CC EA  Final   Culture NO GROWTH 1 DAY  Final   Report Status PENDING  Incomplete  Urine culture     Status: None   Collection Time: 05/09/2016  8:45 AM  Result Value Ref Range Status   Specimen Description URINE, CLEAN CATCH  Final   Special Requests NONE  Final   Culture NO GROWTH  Final   Report Status 05/07/2016 FINAL  Final  MRSA PCR Screening     Status: None   Collection Time: 05/07/2016  7:57 PM  Result Value Ref Range Status   MRSA by PCR NEGATIVE NEGATIVE Final    Comment:        The GeneXpert MRSA Assay (FDA approved for NASAL specimens only), is one component of a comprehensive MRSA colonization surveillance program. It is not intended to diagnose MRSA infection nor to guide or monitor treatment for MRSA infections.     Radiology Reports Portable Chest 1 View  Result Date: 05/15/2016 CLINICAL DATA:  Shortness of Breath EXAM: PORTABLE CHEST 1 VIEW COMPARISON:  05/19/2016 FINDINGS: Cardiac shadow is enlarged but stable. The overall inspiratory effort is poor with bibasilar atelectatic changes mild vascular congestion is also noted likely accentuated by the poor inspiration. No new focal abnormality is seen. IMPRESSION: Poor inspiratory effort with bibasilar changes slightly worse on the left than the right. Electronically Signed   By: Alcide Clever M.D.   On: 05/01/2016 07:38   Dg Chest Port 1 View  Result Date: 05/16/2016 CLINICAL DATA:  Weakness. Altered mental status tonight. Weight loss. EXAM: PORTABLE CHEST 1 VIEW COMPARISON:  None. FINDINGS: Lung volumes are low. Multiple overlying monitoring devices. Mild cardiomegaly. Atherosclerosis of the thoracic aorta. Hazy lung base opacities suggest pleural effusions and bibasilar airspace disease. Mild vascular congestion.  Streaky left midlung opacity. No pneumothorax. No evidence of acute osseous abnormality. IMPRESSION: Mild cardiomegaly. Hazy lung base opacities suggest pleural effusions and bibasilar airspace disease, likely atelectasis. Mild vascular congestion. Findings may reflect fluid overload/ CHF. Electronically Signed   By: Rubye Oaks M.D.   On: 05/01/2016 05:34   Dg Abd Portable 1v  Result Date: 04/30/2016 CLINICAL DATA:  Constipation. EXAM: PORTABLE ABDOMEN - 1 VIEW COMPARISON:  No recent prior . FINDINGS: Soft tissue structures are unremarkable. Gas pattern nonspecific. Prominent stool volume. Constipation cannot be excluded. Bibasilar subsegmental atelectasis and/or infiltrates. Cardiomegaly . IMPRESSION: 1. Prominent stool volume. Constipation cannot be excluded. No bowel distention. 2. Bibasilar subsegmental atelectasis and or infiltrates. Cardiomegaly. Electronically Signed   By: Maisie Fus  Register   On: 05/01/2016 08:30   US Abdomen Limited Ruq  Result Date: 05/19/2016 CLINICAL DATA:  83 year old male with elevated liver enzymes. Initial encounter. EXAM: US ABDOMEN LIMITED - RIGHT UPPER QUADRANT COMPARISON:  None. FINDINGS: Gallbladder: Gallbladder sludge without gallbladder wall thickening. Patient not tender over this region during scanning. Common bile duct: Diameter: 3.4 mm. Liver: Multiple liver lesions. Majority are echogenic measuring up to 4.2 cm. Single 1.3 cm cystic lesion. Possibility of metastatic disease is raised. Ascites and right-sided pleural effusion. IMPRESSION: Multiple liver lesions raises possibly of metastatic disease. Ascites and right-sided pleural effusion. Gallbladder sludge without gallbladder wall thickening. Patient not tender over this region during scanning per ultrasound technologist. Electronically Signed   By: Lacy Duverney M.D.   On: 05/16/2016 11:38    Time Spent in minutes  30   SINGH,PRASHANT K M.D on 06-12-16 at 7:58 AM  Between 7am to 7pm - Pager -  [phone] ( page via [facility], text pages only, please mention full 10 digit call back number).  After 7pm go to [email or web address] - password [facility]  [facility] -  Office  [phone]

## 2016-05-31 DEATH — deceased

## 2018-10-08 IMAGING — CR DG CHEST 1V PORT
1 series · 1 of 1 positions shown · non-contrast
Comparison: None.

CLINICAL DATA: Weakness. Altered mental status tonight. Weight
loss.

EXAM:
PORTABLE CHEST 1 VIEW

[AP]
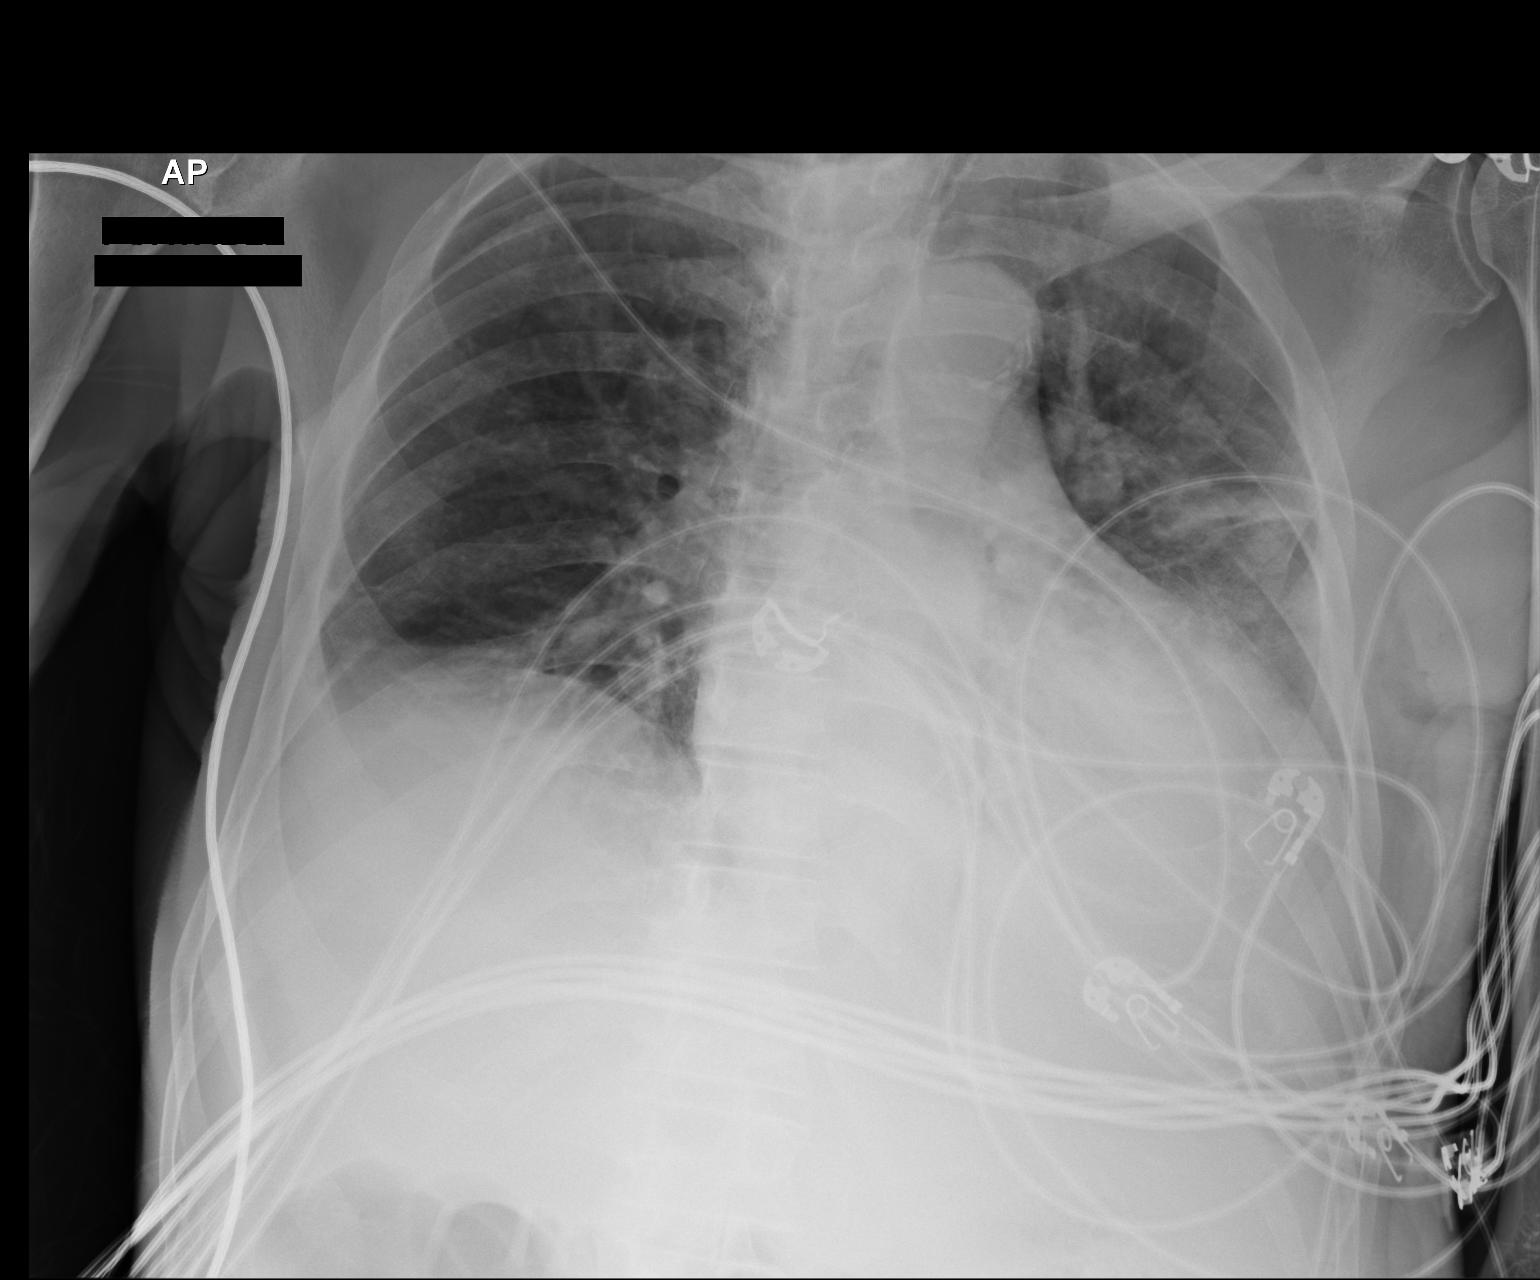

[1 of 1 positions shown; findings below may reference images not displayed]

FINDINGS: Lung volumes are low. Multiple overlying monitoring devices. Mild
cardiomegaly. Atherosclerosis of the thoracic aorta. Hazy lung base
opacities suggest pleural effusions and bibasilar airspace disease.
Mild vascular congestion. Streaky left midlung opacity. No
pneumothorax. No evidence of acute osseous abnormality.
IMPRESSION: Mild cardiomegaly. Hazy lung base opacities suggest pleural
effusions and bibasilar airspace disease, likely atelectasis. Mild
vascular congestion. Findings may reflect fluid overload/ CHF.

## 2018-10-09 IMAGING — CR DG CHEST 1V PORT
1 series · 1 of 1 positions shown · non-contrast
Comparison: 05/25/2016

CLINICAL DATA: Shortness of Breath

EXAM:
PORTABLE CHEST 1 VIEW

[AP]
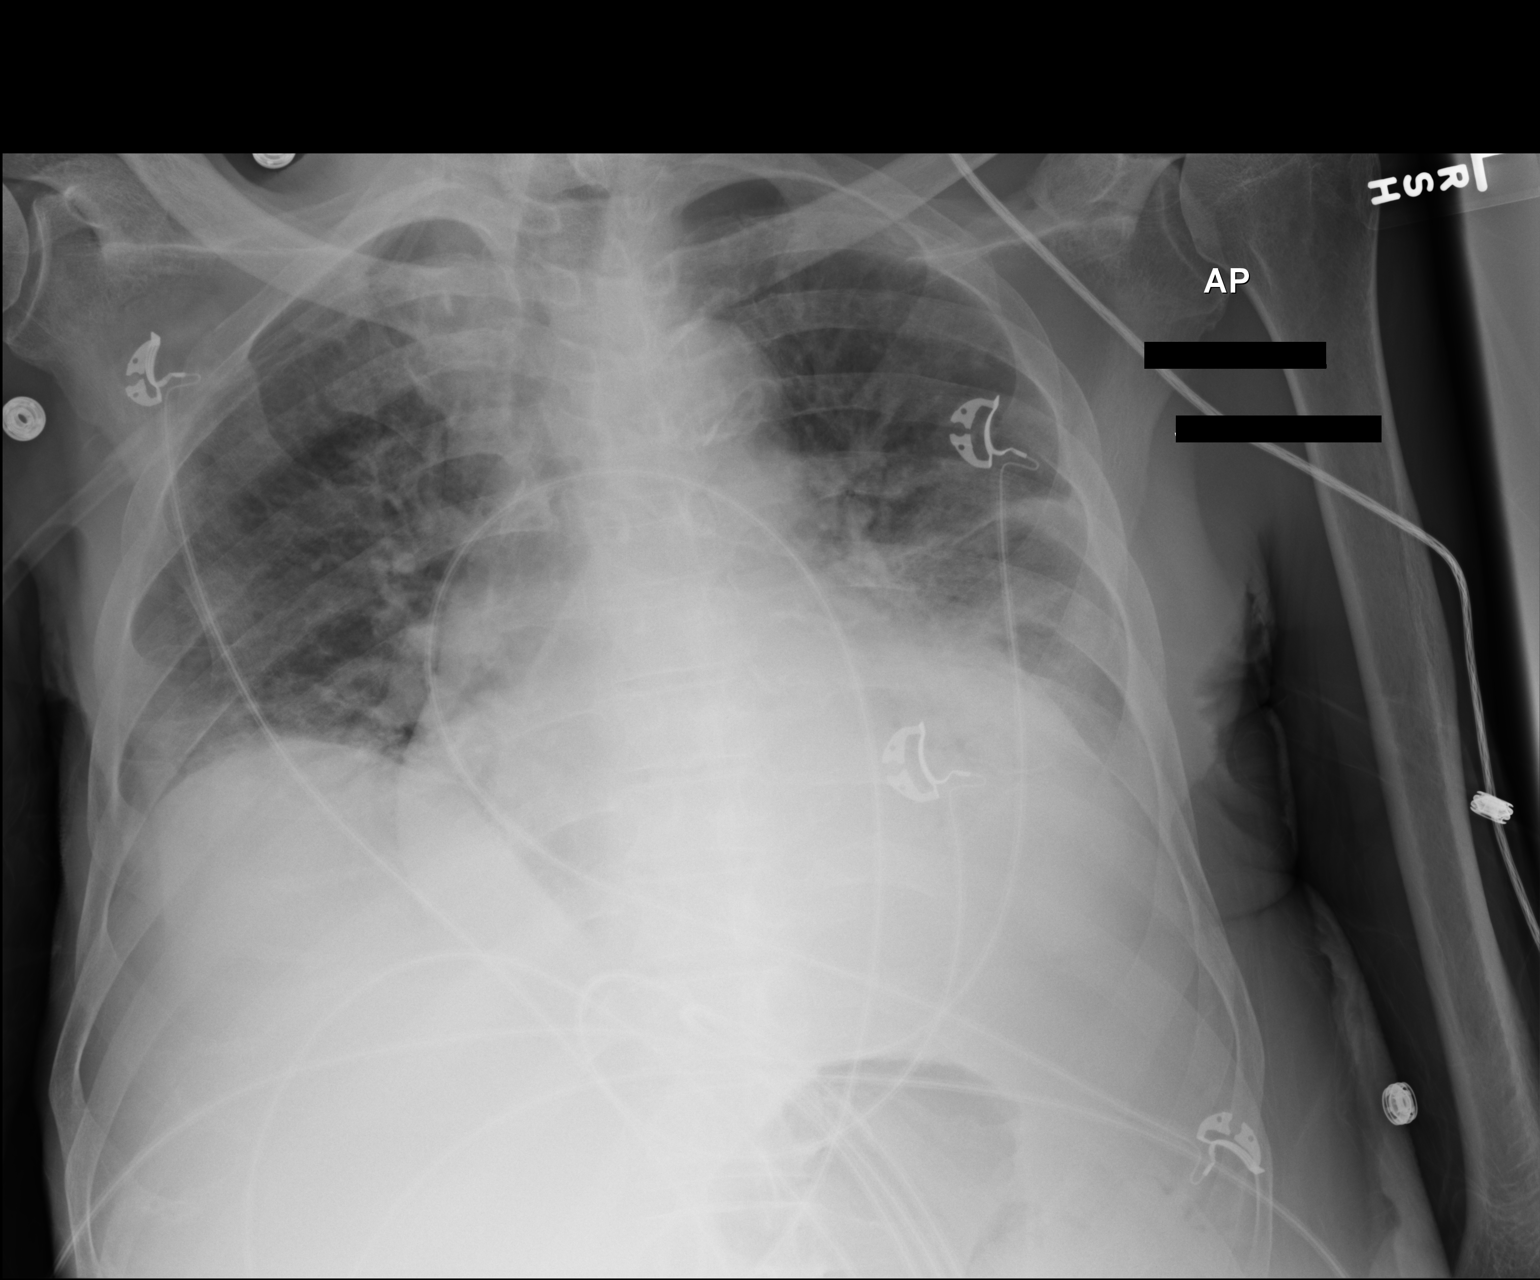

[1 of 1 positions shown; findings below may reference images not displayed]

FINDINGS: Cardiac shadow is enlarged but stable. The overall inspiratory
effort is poor with bibasilar atelectatic changes mild vascular
congestion is also noted likely accentuated by the poor inspiration.
No new focal abnormality is seen.
IMPRESSION: Poor inspiratory effort with bibasilar changes slightly worse on the
left than the right.
